# Patient Record
Sex: Male | Born: 1975 | Race: White | Hispanic: No | Marital: Married | Smoking: Never smoker
Health system: Southern US, Community
[De-identification: ages and names within clinical notes are randomized; demographics above are authoritative.]

## PROBLEM LIST (undated history)

## (undated) DIAGNOSIS — I10 Essential (primary) hypertension: Secondary | ICD-10-CM

## (undated) DIAGNOSIS — E7800 Pure hypercholesterolemia, unspecified: Secondary | ICD-10-CM

## (undated) DIAGNOSIS — R011 Cardiac murmur, unspecified: Secondary | ICD-10-CM

## (undated) DIAGNOSIS — G4730 Sleep apnea, unspecified: Secondary | ICD-10-CM

## (undated) DIAGNOSIS — Z86010 Personal history of colonic polyps: Secondary | ICD-10-CM

## (undated) DIAGNOSIS — K6389 Other specified diseases of intestine: Secondary | ICD-10-CM

## (undated) DIAGNOSIS — K219 Gastro-esophageal reflux disease without esophagitis: Secondary | ICD-10-CM

## (undated) DIAGNOSIS — J069 Acute upper respiratory infection, unspecified: Secondary | ICD-10-CM

## (undated) DIAGNOSIS — T7840XA Allergy, unspecified, initial encounter: Secondary | ICD-10-CM

## (undated) DIAGNOSIS — F419 Anxiety disorder, unspecified: Secondary | ICD-10-CM

## (undated) HISTORY — DX: Allergy, unspecified, initial encounter: T78.40XA

## (undated) HISTORY — DX: Other specified diseases of intestine: K63.89

## (undated) HISTORY — DX: Sleep apnea, unspecified: G47.30

## (undated) HISTORY — PX: HAND SURGERY: SHX662

## (undated) HISTORY — DX: Anxiety disorder, unspecified: F41.9

## (undated) HISTORY — DX: Acute upper respiratory infection, unspecified: J06.9

## (undated) HISTORY — DX: Cardiac murmur, unspecified: R01.1

## (undated) HISTORY — DX: Personal history of colonic polyps: Z86.010

## (undated) HISTORY — DX: Gastro-esophageal reflux disease without esophagitis: K21.9

## (undated) HISTORY — PX: KNEE ARTHROSCOPY WITH LATERAL MENISECTOMY: SHX6193

---

## 2008-08-09 ENCOUNTER — Emergency Department (HOSPITAL_COMMUNITY): Admission: EM | Admit: 2008-08-09 | Discharge: 2008-08-09 | Payer: Self-pay | Admitting: Internal Medicine

## 2010-12-26 ENCOUNTER — Encounter: Payer: Self-pay | Admitting: Family Medicine

## 2010-12-26 ENCOUNTER — Ambulatory Visit (INDEPENDENT_AMBULATORY_CARE_PROVIDER_SITE_OTHER): Payer: BC Managed Care – PPO | Admitting: Family Medicine

## 2010-12-26 VITALS — BP 120/84 | Temp 98.3°F | Ht 71.0 in | Wt 250.0 lb

## 2010-12-26 DIAGNOSIS — J029 Acute pharyngitis, unspecified: Secondary | ICD-10-CM

## 2010-12-26 LAB — POCT RAPID STREP A (OFFICE): Rapid Strep A Screen: NEGATIVE

## 2010-12-26 MED ORDER — HYDROCODONE-ACETAMINOPHEN 7.5-750 MG PO TABS: 1.0000 | ORAL_TABLET | Freq: Four times a day (QID) | ORAL | Status: AC | PRN

## 2010-12-26 NOTE — Progress Notes (Signed)
  Subjective:    Patient ID: Dennis Aguilar, male    DOB: [birth year], 35 y.o.   MRN: [id number]  HPI Dennis Aguilar is a 35 year old, married male, nonsmoker, who comes in today for evaluation of a one-day history of fever, chills, aching all over, severe sore throat.  Review of systems otherwise negative   Review of Systems Neg     Objective:   Physical Exam Well-developed well-nourished, male, no acute distress.  HEENT negative.  The neck was supple.  No adenopathy.  Throat was red.  No exudate.  Rapid strep negative       Assessment & Plan:  Viral sore throat,,,,,,,,,,, Tylenol and Vicodin because of severe pain.  Return p.r.n.

## 2010-12-26 NOTE — Patient Instructions (Signed)
Tylenol or Motrin for general fever, chills, etc.  Vicodin one half to one tablet every 4 to 6 hours as needed for severe pain.  Return p.r.n.

## 2011-07-29 LAB — POCT I-STAT, CHEM 8
Calcium, Ion: 1.17
Glucose, Bld: 100 — ABNORMAL HIGH
Hemoglobin: 15.6
Sodium: 139

## 2011-07-29 LAB — URINALYSIS, ROUTINE W REFLEX MICROSCOPIC
Bilirubin Urine: NEGATIVE
Hgb urine dipstick: NEGATIVE
pH: 6

## 2016-04-26 ENCOUNTER — Emergency Department (HOSPITAL_COMMUNITY)
Admission: EM | Admit: 2016-04-26 | Discharge: 2016-04-26 | Disposition: A | Discharge status: Home / Self Care | Payer: BLUE CROSS/BLUE SHIELD | Attending: Emergency Medicine | Admitting: Emergency Medicine

## 2016-04-26 ENCOUNTER — Emergency Department (HOSPITAL_COMMUNITY): Payer: BLUE CROSS/BLUE SHIELD

## 2016-04-26 ENCOUNTER — Encounter (HOSPITAL_COMMUNITY): Payer: Self-pay

## 2016-04-26 DIAGNOSIS — R142 Eructation: Secondary | ICD-10-CM

## 2016-04-26 DIAGNOSIS — Z8719 Personal history of other diseases of the digestive system: Secondary | ICD-10-CM | POA: Diagnosis not present

## 2016-04-26 DIAGNOSIS — K760 Fatty (change of) liver, not elsewhere classified: Secondary | ICD-10-CM | POA: Diagnosis not present

## 2016-04-26 DIAGNOSIS — I10 Essential (primary) hypertension: Secondary | ICD-10-CM | POA: Insufficient documentation

## 2016-04-26 DIAGNOSIS — K30 Functional dyspepsia: Secondary | ICD-10-CM | POA: Diagnosis not present

## 2016-04-26 DIAGNOSIS — Z79899 Other long term (current) drug therapy: Secondary | ICD-10-CM | POA: Insufficient documentation

## 2016-04-26 DIAGNOSIS — R140 Abdominal distension (gaseous): Secondary | ICD-10-CM | POA: Diagnosis not present

## 2016-04-26 DIAGNOSIS — R109 Unspecified abdominal pain: Secondary | ICD-10-CM | POA: Diagnosis not present

## 2016-04-26 DIAGNOSIS — R079 Chest pain, unspecified: Secondary | ICD-10-CM | POA: Diagnosis not present

## 2016-04-26 DIAGNOSIS — R030 Elevated blood-pressure reading, without diagnosis of hypertension: Secondary | ICD-10-CM | POA: Diagnosis not present

## 2016-04-26 DIAGNOSIS — R0602 Shortness of breath: Secondary | ICD-10-CM | POA: Diagnosis not present

## 2016-04-26 HISTORY — DX: Essential (primary) hypertension: I10

## 2016-04-26 HISTORY — DX: Pure hypercholesterolemia, unspecified: E78.00

## 2016-04-26 LAB — URINALYSIS, ROUTINE W REFLEX MICROSCOPIC
Bilirubin Urine: NEGATIVE
GLUCOSE, UA: NEGATIVE mg/dL
HGB URINE DIPSTICK: NEGATIVE
KETONES UR: 15 mg/dL — AB
LEUKOCYTES UA: NEGATIVE
Nitrite: NEGATIVE
PROTEIN: NEGATIVE mg/dL
Specific Gravity, Urine: 1.016 (ref 1.005–1.030)
pH: 6 (ref 5.0–8.0)

## 2016-04-26 LAB — BASIC METABOLIC PANEL
ANION GAP: 7 (ref 5–15)
BUN: 14 mg/dL (ref 6–20)
CALCIUM: 9.4 mg/dL (ref 8.9–10.3)
CO2: 26 mmol/L (ref 22–32)
Chloride: 105 mmol/L (ref 101–111)
Creatinine, Ser: 1.07 mg/dL (ref 0.61–1.24)
Glucose, Bld: 113 mg/dL — ABNORMAL HIGH (ref 65–99)
POTASSIUM: 4 mmol/L (ref 3.5–5.1)
SODIUM: 138 mmol/L (ref 135–145)

## 2016-04-26 LAB — I-STAT TROPONIN, ED: TROPONIN I, POC: 0 ng/mL (ref 0.00–0.08)

## 2016-04-26 LAB — CBC
HEMATOCRIT: 44.6 % (ref 39.0–52.0)
HEMOGLOBIN: 15.8 g/dL (ref 13.0–17.0)
MCH: 31.7 pg (ref 26.0–34.0)
MCHC: 35.4 g/dL (ref 30.0–36.0)
MCV: 89.4 fL (ref 78.0–100.0)
Platelets: 255 10*3/uL (ref 150–400)
RBC: 4.99 MIL/uL (ref 4.22–5.81)
RDW: 12.9 % (ref 11.5–15.5)
WBC: 6.8 10*3/uL (ref 4.0–10.5)

## 2016-04-26 MED ORDER — SODIUM CHLORIDE 0.9 % IV BOLUS (SEPSIS)
1000.0000 mL | Freq: Once | INTRAVENOUS | Status: AC
  Administered 2016-04-26: 1000 mL via INTRAVENOUS

## 2016-04-26 MED ORDER — IOPAMIDOL (ISOVUE-300) INJECTION 61%
INTRAVENOUS | Status: AC
  Administered 2016-04-26: 100 mL
  Filled 2016-04-26: qty 100

## 2016-04-26 MED ORDER — GI COCKTAIL ~~LOC~~
30.0000 mL | Freq: Once | ORAL | Status: AC
  Administered 2016-04-26: 30 mL via ORAL
  Filled 2016-04-26: qty 30

## 2016-04-26 NOTE — ED Notes (Signed)
Pt transported to CT ?

## 2016-04-26 NOTE — ED Provider Notes (Signed)
CSN: [id number]     Arrival date & time 04/26/16  1420 History   First MD Initiated Contact with Patient 04/26/16 1550     Chief Complaint  Patient presents with  . indigestion    PT IS A 39 YO WM WHO HAS HAD PERSISTENT BELCHING FOR 4 DAYS.  THE PT SAID THIS IS VERY UNUSUAL FOR HIM.  HE HAS BEEN TAKING OTC GERD MEDS, BUT THAT HAS NOT BEEN HELPING.  THE PT SAW HIS PCP TODAY AND WAS TOLD TO TAKE ZANTAC.  THE PT STARTED WORRYING THAT THIS WAS MAYBE A MORE SERIOUS PROBLEM, SO HE CAME TO THE ED.  HE HAS HAD SOME ABDOMINAL PAIN, BUT HE IS NOT SURE IF THAT IS FROM DOING PILATES THIS WEEK OR NOT.    (Consider location/radiation/quality/duration/timing/severity/associated sxs/prior Treatment) The history is provided by the patient.    Past Medical History  Diagnosis Date  . High cholesterol   . Hypertension    History reviewed. No pertinent past surgical history. No family history on file. Social History  Substance Use Topics  . Smoking status: Never Smoker   . Smokeless tobacco: Never Used  . Alcohol Use: None    Review of Systems  Gastrointestinal: Positive for abdominal pain.       BELCHING  All other systems reviewed and are negative.     Allergies  Review of patient's allergies indicates no known allergies.  Home Medications   Prior to Admission medications   Medication Sig Start Date End Date Taking? Authorizing Provider  alum & mag hydroxide-simeth (MAALOX/MYLANTA) 200-200-20 MG/5ML suspension Take 15 mLs by mouth every 6 (six) hours as needed for indigestion or heartburn.   Yes Historical Provider, MD  Ca Carbonate-Mag Hydroxide (ROLAIDS) 550-110 MG CHEW Chew 1 each by mouth daily as needed (for heartnurn).   Yes Historical Provider, MD  CRESTOR 10 MG tablet Take 20 mg by mouth daily.  03/06/16  Yes Historical Provider, MD  metoprolol succinate (TOPROL-XL) 50 MG 24 hr tablet Take 50 mg by mouth daily. 04/11/16  Yes Historical Provider, MD  simethicone (MYLICON) 0000000 MG  chewable tablet Chew 125 mg by mouth every 6 (six) hours as needed for flatulence.   Yes Historical Provider, MD  valsartan (DIOVAN) 160 MG tablet Take 160 mg by mouth daily. 04/14/16  Yes Historical Provider, MD   BP 136/78 mmHg  Pulse 66  Temp(Src) 97.6 F (36.4 C)  Resp 14  Ht 5\' 10"  (1.778 m)  Wt 260 lb (117.935 kg)  BMI 37.31 kg/m2  SpO2 99% Physical Exam  Constitutional: He is oriented to person, place, and time. He appears well-developed and well-nourished.  HENT:  Head: Normocephalic and atraumatic.  Right Ear: External ear normal.  Left Ear: External ear normal.  Nose: Nose normal.  Mouth/Throat: Oropharynx is clear and moist.  Eyes: Conjunctivae and EOM are normal. Pupils are equal, round, and reactive to light.  Neck: Normal range of motion. Neck supple.  Cardiovascular: Normal rate, regular rhythm, normal heart sounds and intact distal pulses.   Pulmonary/Chest: Effort normal and breath sounds normal.  Abdominal: Soft. Bowel sounds are normal.  Musculoskeletal: Normal range of motion.  Neurological: He is alert and oriented to person, place, and time.  Skin: Skin is warm and dry.  Psychiatric: He has a normal mood and affect. His behavior is normal. Judgment and thought content normal.  Nursing note and vitals reviewed.   ED Course  Procedures (including critical care time) Labs Review Labs Reviewed  BASIC METABOLIC PANEL - Abnormal; Notable for the following:    Glucose, Bld 113 (*)    All other components within normal limits  URINALYSIS, ROUTINE W REFLEX MICROSCOPIC (NOT AT [facility]) - Abnormal; Notable for the following:    Ketones, ur 15 (*)    All other components within normal limits  CBC  I-STAT TROPOININ, ED    Imaging Review Dg Chest 2 View  04/26/2016  CLINICAL DATA:  Shortness of breath. Chest pain and abdominal distention for 3 days. EXAM: CHEST  2 VIEW COMPARISON:  None. FINDINGS: The heart size and mediastinal contours are within normal limits.  Both lungs are clear. The visualized skeletal structures are unremarkable. IMPRESSION: Negative.  No active cardiopulmonary disease. Electronically Signed   By: Earle Gell M.D.   On: 04/26/2016 15:06   Ct Abdomen Pelvis W Contrast  04/26/2016  CLINICAL DATA:  Persistent belching since last Wednesday. EXAM: CT ABDOMEN AND PELVIS WITH CONTRAST TECHNIQUE: Multidetector CT imaging of the abdomen and pelvis was performed using the standard protocol following bolus administration of intravenous contrast. CONTRAST:  18mL ISOVUE-300 IOPAMIDOL (ISOVUE-300) INJECTION 61% COMPARISON:  None. FINDINGS: Lower chest:  Unremarkable. Hepatobiliary: The liver shows diffusely decreased attenuation suggesting steatosis. Scattered areas of sparing are seen along the gallbladder fossa. No focal abnormality within the liver parenchyma. There is no evidence for gallstones, gallbladder wall thickening, or pericholecystic fluid. No intrahepatic or extrahepatic biliary dilation. Pancreas: No focal mass lesion. No dilatation of the main duct. No intraparenchymal cyst. No peripancreatic edema. Spleen: No splenomegaly. No focal mass lesion. Adrenals/Urinary Tract: No adrenal nodule or mass. The kidneys are normal bilaterally. 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 No evidence for hydroureter. The urinary bladder appears normal for the degree of distention. Stomach/Bowel: Stomach is nondistended. No gastric wall thickening. No evidence of outlet obstruction. Duodenum is normally positioned as is the ligament of Treitz. No small bowel wall thickening. No small bowel dilatation. The terminal ileum is normal. The appendix is normal. No gross colonic mass. No colonic wall thickening. No substantial diverticular change. Vascular/Lymphatic: There is abdominal aortic atherosclerosis without aneurysm. There is no gastrohepatic or hepatoduodenal ligament lymphadenopathy. No intraperitoneal or  retroperitoneal lymphadenopathy. No pelvic sidewall lymphadenopathy. Reproductive: The prostate gland and seminal vesicles have normal imaging features. Other: No intraperitoneal free fluid. Musculoskeletal: Bone windows reveal no worrisome lytic or sclerotic osseous lesions. IMPRESSION: No acute findings in the abdomen or pelvis. Specifically, no findings to explain the patient's history of persistent belching. Hepatic steatosis. Electronically Signed   By: Misty Stanley M.D.   On: 04/26/2016 18:02   I have personally reviewed and evaluated these images and lab results as part of my medical decision-making.   EKG Interpretation   Date/Time:  Saturday April 26 2016 14:24:54 EDT Ventricular Rate:  72 PR Interval:  138 QRS Duration: 92 QT Interval:  394 QTC Calculation: 431 R Axis:   69 Text Interpretation:  Normal sinus rhythm Normal ECG Confirmed by Reid Nawrot  MD, [place] ([id number]) on 04/26/2016 3:50:56 PM      MDM  PT IS FEELING BETTER.  HE IS INSTR TO TAKE ZANTAC AND GAS-X.  HE IS INSTR TO AVOID CARBONATED BEV AND TO EAT SLOWLY.  RETURN IF WORSE. Final diagnoses:  Belching       Isla Pence, MD 04/26/16 [phone]

## 2016-04-26 NOTE — ED Notes (Signed)
Patient complains of 4 days of persistent burping. Seen by his MD today and started on reflux meds with no relief. denies nausea, denies chest pain

## 2016-04-28 DIAGNOSIS — R140 Abdominal distension (gaseous): Secondary | ICD-10-CM | POA: Diagnosis not present

## 2016-04-28 DIAGNOSIS — R142 Eructation: Secondary | ICD-10-CM | POA: Diagnosis not present

## 2016-11-17 DIAGNOSIS — M25512 Pain in left shoulder: Secondary | ICD-10-CM | POA: Diagnosis not present

## 2016-11-17 DIAGNOSIS — M25511 Pain in right shoulder: Secondary | ICD-10-CM | POA: Diagnosis not present

## 2016-12-04 DIAGNOSIS — M25312 Other instability, left shoulder: Secondary | ICD-10-CM | POA: Diagnosis not present

## 2016-12-04 DIAGNOSIS — R531 Weakness: Secondary | ICD-10-CM | POA: Diagnosis not present

## 2016-12-04 DIAGNOSIS — M25311 Other instability, right shoulder: Secondary | ICD-10-CM | POA: Diagnosis not present

## 2016-12-10 DIAGNOSIS — M25311 Other instability, right shoulder: Secondary | ICD-10-CM | POA: Diagnosis not present

## 2016-12-10 DIAGNOSIS — R531 Weakness: Secondary | ICD-10-CM | POA: Diagnosis not present

## 2016-12-10 DIAGNOSIS — M25312 Other instability, left shoulder: Secondary | ICD-10-CM | POA: Diagnosis not present

## 2016-12-18 DIAGNOSIS — R350 Frequency of micturition: Secondary | ICD-10-CM | POA: Diagnosis not present

## 2016-12-18 DIAGNOSIS — I10 Essential (primary) hypertension: Secondary | ICD-10-CM | POA: Diagnosis not present

## 2016-12-18 DIAGNOSIS — R531 Weakness: Secondary | ICD-10-CM | POA: Diagnosis not present

## 2016-12-18 DIAGNOSIS — M25312 Other instability, left shoulder: Secondary | ICD-10-CM | POA: Diagnosis not present

## 2016-12-18 DIAGNOSIS — N3000 Acute cystitis without hematuria: Secondary | ICD-10-CM | POA: Diagnosis not present

## 2016-12-18 DIAGNOSIS — M25311 Other instability, right shoulder: Secondary | ICD-10-CM | POA: Diagnosis not present

## 2016-12-18 DIAGNOSIS — R030 Elevated blood-pressure reading, without diagnosis of hypertension: Secondary | ICD-10-CM | POA: Diagnosis not present

## 2016-12-19 DIAGNOSIS — R531 Weakness: Secondary | ICD-10-CM | POA: Diagnosis not present

## 2016-12-19 DIAGNOSIS — M25312 Other instability, left shoulder: Secondary | ICD-10-CM | POA: Diagnosis not present

## 2016-12-19 DIAGNOSIS — M25311 Other instability, right shoulder: Secondary | ICD-10-CM | POA: Diagnosis not present

## 2016-12-24 DIAGNOSIS — M25312 Other instability, left shoulder: Secondary | ICD-10-CM | POA: Diagnosis not present

## 2016-12-24 DIAGNOSIS — R531 Weakness: Secondary | ICD-10-CM | POA: Diagnosis not present

## 2016-12-24 DIAGNOSIS — M25311 Other instability, right shoulder: Secondary | ICD-10-CM | POA: Diagnosis not present

## 2017-01-01 DIAGNOSIS — R531 Weakness: Secondary | ICD-10-CM | POA: Diagnosis not present

## 2017-01-01 DIAGNOSIS — M25311 Other instability, right shoulder: Secondary | ICD-10-CM | POA: Diagnosis not present

## 2017-01-01 DIAGNOSIS — M25312 Other instability, left shoulder: Secondary | ICD-10-CM | POA: Diagnosis not present

## 2017-02-25 ENCOUNTER — Ambulatory Visit
Payer: BLUE CROSS/BLUE SHIELD | Attending: Internal Medicine | Admitting: Rehabilitative and Restorative Service Providers"

## 2017-02-25 DIAGNOSIS — R2681 Unsteadiness on feet: Secondary | ICD-10-CM | POA: Diagnosis not present

## 2017-02-25 DIAGNOSIS — R2689 Other abnormalities of gait and mobility: Secondary | ICD-10-CM | POA: Diagnosis not present

## 2017-02-25 DIAGNOSIS — R42 Dizziness and giddiness: Secondary | ICD-10-CM | POA: Diagnosis not present

## 2017-02-25 NOTE — Patient Instructions (Signed)
Gaze Stabilization - Tip Card  1.Target must remain in focus, not blurry, and appear stationary while head is in motion. 2.Perform exercises with small head movements (45 to either side of midline). 3.Increase speed of head motion so long as target is in focus. 4.If you wear eyeglasses, be sure you can see target through lens (therapist will give specific instructions for bifocal / progressive lenses). 5.These exercises may provoke dizziness or nausea. Work through these symptoms. If too dizzy, slow head movement slightly. Rest between each exercise. 6.Exercises demand concentration; avoid distractions. 7.For safety, perform standing exercises close to a counter, wall, corner, or next to someone.  Copyright  VHI. All rights reserved.   Gaze Stabilization - Standing Feet Apart   Feet shoulder width apart, keeping eyes on target on wall 3 feet away, tilt head down slightly and move head side to side for 30 seconds. Repeat while moving head up and down for 30 seconds. *Work up to tolerating 60 seconds, as able. Do 2-3 sessions per day.    Copyright  VHI. All rights reserved.  Visuo-Vestibular: Head / Eyes Moving in Opposite Direction    Holding a target, keep eyes on target and slowly move target side to side while moving head in OPPOSITE direction of target for __3__ seconds. Perform sitting. Repeat __10__ times per session. Do __2__ sessions per day.   Copyright  VHI. All rights reserved.     Feet Together (Compliant Surface) Varied Arm Positions - Eyes Closed    Stand on compliant surface: _pillow_______ with feet together and arms out. Close eyes and visualize upright position. Hold__30__ seconds. Repeat __3__ times per session. Do __2__ sessions per day.  Copyright  VHI. All rights reserved.

## 2017-02-26 ENCOUNTER — Ambulatory Visit: Payer: BLUE CROSS/BLUE SHIELD | Admitting: Physical Therapy

## 2017-02-26 ENCOUNTER — Encounter: Payer: BLUE CROSS/BLUE SHIELD | Admitting: Physical Therapy

## 2017-02-26 NOTE — Therapy (Signed)
[facility] [address] [place], [place], [address] Phone: [phone]   Fax:  [phone]  Physical Therapy Evaluation  Patient Details  Name: Dennis Aguilar MRN: [id number] Date of Birth: May 20, 1976 Referring Provider: Suzi Roots, MD (@ [place])  Encounter Date: 02/25/2017      PT End of Session - 02/26/17 1531    Visit Number 1   Number of Visits 8   Date for PT Re-Evaluation 04/27/17   Authorization Type private insurance   PT Start Time 1320   PT Stop Time 1415   PT Time Calculation (min) 55 min   Activity Tolerance Patient tolerated treatment well   Behavior During Therapy [facility] for tasks assessed/performed      Past Medical History:  Diagnosis Date  . High cholesterol   . Hypertension     No past surgical history on file.  There were no vitals filed for this visit.       Subjective Assessment - 02/25/17 1326    Subjective The patient had onset of severe vertigo with nausea and difficulty walking on 01/26/2017.  He was seen at ED and provided valium.  The spinning stopped after the third day.  He has continued with oscillopsia with walking and driving, nausea with driving,   Overall symptoms are lessening.  He has been working on balance and head movement exercises at home since mid April.  He notes some unsteadiness on unlevel surfaces.  He does not notice any positional symptoms with bed mobility--at tmes, looking up quickly can be unsettling.    Saw ENT at [facility] except one note on the right side.    Pertinent History had a nose bleed last night; HTN, allergies, h/o nose bloods.   Patient Stated Goals see if symptoms can improve   Currently in Pain? No/denies            [facility] PT Assessment - 02/25/17 1348      Assessment   Medical Diagnosis vertigo   Referring Provider Suzi Roots, MD (@ [place])   Onset Date/Surgical Date 01/26/17   Prior Therapy none     Precautions   Precautions None     Restrictions   Weight Bearing Restrictions No     Balance Screen   Has the patient fallen in the past 6 months No   Has the patient had a decrease in activity level because of a fear of falling?  Yes  due to onset of vertigo   Is the patient reluctant to leave their home because of a fear of falling?  No  has returned to driving recently     [place] residence   Living Arrangements Spouse/significant other   Type of [place] to enter   Home Layout Two level   [place] None     Prior Function   Level of Independence Independent   Vocation Full time employment   Leisure active with family/ kids' sports     Observation/Other Assessments   Focus on Therapeutic Outcomes (FOTO)  74%   Other Surveys  Other Surveys   Dizziness Handicap Inventory [facility])  62%     Standardized Balance Assessment   Biomedical engineer    Results Scores 59% compared to age/heigh normative values of 42%.  Patient WNLs use of somatosensory input for balance, and  mildly diminished use of visual (60% compared to norm of 70%) and vestibular (52% compared to 55%) inputs for balance.            Vestibular Assessment - 02/25/17 1336      Vestibular Assessment   General Observation The patient walks independently into clinic without a device independently.     Symptom Behavior   Type of Dizziness Oscillopsia  imbalance   Frequency of Dizziness daily   Duration of Dizziness not true dizziness at this time   Aggravating Factors --  driving   Relieving Factors Head stationary     Occulomotor Exam   Occulomotor Alignment Normal   Spontaneous Absent   Gaze-induced Absent   Head shaking Horizontal --  noted 2-3 beats of nystagmus upon opening eyes   Smooth Pursuits Intact   Saccades Intact     Vestibulo-Occular Reflex    VOR 1 Head Only (x 1 viewing) slow gaze- patient able to perform with trace of dizziness   VOR 2 Head and Object (x 2 viewing) at slow pace, patient able to maintain contact   Comment Head impulse test=positive bilaterally with small amplitude refixation saccade.     Visual Acuity   Static line 8   Dynamic line 3  5 line difference indicating decreased VOR     Positional Testing   Dix-Hallpike Dix-Hallpike Right;Dix-Hallpike Left  used frenzel lenses to block fixation   Horizontal Canal Testing Horizontal Canal Right;Horizontal Canal Left     Dix-Hallpike Right   Dix-Hallpike Right Duration none   Dix-Hallpike Right Symptoms No nystagmus     Dix-Hallpike Left   Dix-Hallpike Left Duration none   Dix-Hallpike Left Symptoms No nystagmus     Horizontal Canal Right   Horizontal Canal Right Duration none   Horizontal Canal Right Symptoms Normal     Horizontal Canal Left   Horizontal Canal Left Duration none   Horizontal Canal Left Symptoms Normal               [facility] Adult PT Treatment/Exercise - 02/26/17 1542      Neuro Re-ed    Neuro Re-ed Details  Standing in corner with eyes closed on compliant surface with feet narrow base of support for HEP.          Vestibular Treatment/Exercise - 02/26/17 1542      Vestibular Treatment/Exercise   Vestibular Treatment Provided Gaze   Gaze Exercises X1 Viewing Horizontal;X1 Viewing Vertical;X2 Viewing Horizontal     X1 Viewing Horizontal   Foot Position standing feet apart   Comments 30 seconds with cues to slow movement and maintain visual fixation without retinal slip (blurring of target)     X1 Viewing Vertical   Foot Position standing feet apart   Comments 30 seconds with cues on technique     X2 Viewing Horizontal   Foot Position seated    Comments with cues for 10 reps working on eye/head coordination and VOR.               PT Education - 02/26/17 1530    Education provided Yes   Education Details  HEP: gaze x 1 adaptation exercises standing, gaze x 2 adaptation seated, standing pillow + feet together + eyes closed   Person(s) Educated Patient   Methods Explanation;Demonstration;Handout   Comprehension Verbalized understanding;Returned demonstration          PT Short Term Goals - 02/26/17 1543      PT SHORT TERM GOAL #1   Title  The patient will be indep with HEP for gaze adaptation, balance and visual/vestibular exercises.   Baseline Target date 03/28/2017   Time 4   Period Weeks     PT SHORT TERM GOAL #2   Title The patient will demonstrate gaze x 1 x 60 seconds without evidence of retinal slip (per corrective/refixation saccade).   Baseline Target date 03/28/2017   Time 4   Period Weeks     PT SHORT TERM GOAL #3   Title The patient will improve SVA vs. DVA to 4 line difference to demo improving use of VOR.   Baseline Target date 03/28/2017   Time 4   Period Weeks           PT Long Term Goals - 02/26/17 1545      PT LONG TERM GOAL #1   Title The patient will improve DHI from 62% to < or equal to 42% to demo improved self perception of dizziness.   Baseline Target date 04/27/2017   Time 8   Period Weeks     PT LONG TERM GOAL #2   Title The patient will improve static versus dynamic visual acuity to 3 line difference to demo improved use of VOR.   Baseline Target date 04/27/2017   Time 8   Period Weeks     PT LONG TERM GOAL #3   Title The patient will improve SOT from 59% to > or equal to 70% to demo improved use of sensory systems for balance.   Baseline Target date 04/27/2017   Time 8   Period Weeks     PT LONG TERM GOAL #4   Title The patient will return to recreational activities including golf, longer drives without subjective c/o visual blurring.   Baseline Target date 04/27/2017   Time 8   Period Weeks               Plan - 02/26/17 1554    Clinical Impression Statement The patient is a 42 yo male s/p sudden onset of vertigo in April 2018.  He  initially experienced 3 days of constant vertigo most consistent with clinical presentation of a vestibular neuritis (as hearing was spared).  He continues to c/o visual bouncing with walking, motion sickness with driving, and intermittent unsteadiness.  He has not returned to all recreational tasks due to symptoms.  PT to progress gaze adaptation, high level balance and mulit-sensory balance challenges while encouraging slow return to recreational/sports activities.   Rehab Potential Good   PT Frequency 1x / week   PT Duration 8 weeks   PT Treatment/Interventions ADLs/Self Care Home Management;Therapeutic activities;Therapeutic exercise;Vestibular;Canalith Repostioning;Neuromuscular re-education;Balance training;Gait training;Functional mobility training;Patient/family education   PT Next Visit Plan Progress HEP, discuss return to sports/golf, dynamic gait, VOR x 2 viewing, VOR x 1 on busy background   Consulted and Agree with Plan of Care Patient      Patient will benefit from skilled therapeutic intervention in order to improve the following deficits and impairments:  Dizziness, Impaired vision/preception, Difficulty walking, Decreased balance, Decreased activity tolerance  Visit Diagnosis: Dizziness and giddiness  Other abnormalities of gait and mobility  Unsteadiness on feet     Problem List There are no active problems to display for this patient.   [place], [place] 02/26/2017, 3:57 PM  [facility] [address] [place], [place], [address] Phone: [phone]   Fax:  [phone]  Name: Rayaan Garguilo MRN: [id number] Date of Birth: [birth year]

## 2017-03-04 ENCOUNTER — Ambulatory Visit: Payer: BLUE CROSS/BLUE SHIELD | Admitting: Rehabilitative and Restorative Service Providers"

## 2017-03-06 ENCOUNTER — Ambulatory Visit: Payer: BLUE CROSS/BLUE SHIELD | Admitting: Rehabilitative and Restorative Service Providers"

## 2017-03-06 DIAGNOSIS — R42 Dizziness and giddiness: Secondary | ICD-10-CM

## 2017-03-06 DIAGNOSIS — R2681 Unsteadiness on feet: Secondary | ICD-10-CM

## 2017-03-06 DIAGNOSIS — R2689 Other abnormalities of gait and mobility: Secondary | ICD-10-CM

## 2017-03-06 NOTE — Patient Instructions (Signed)
BEGIN TO ADD MORE ACTIVITIES TO DAY TO DAY INCLUDING:  GOLF:   Begin to work on the putting green and chip shots (slower speed swings) and then slowly progress to driving as you can maintain focus on the ball.  TENNIS BALL: Walk tossing a ball or stand on one leg tossing a ball 10 times to work on dynamic balance.  MARCHING IN PLACE: 20 marches in place while fixating on one target.  [place]

## 2017-03-06 NOTE — Therapy (Signed)
[facility] [address] [place], [place], [address] Phone: [phone]   Fax:  [phone]  Physical Therapy Treatment  Patient Details  Name: Dennis Aguilar MRN: [id number] Date of Birth: September 19, 1976 Referring Provider: Suzi Roots, MD (@ [place])  Encounter Date: 03/06/2017      PT End of Session - 03/06/17 1506    Visit Number 2   Number of Visits 8   Date for PT Re-Evaluation 04/27/17   Authorization Type private insurance   PT Start Time 1410   PT Stop Time 1500   PT Time Calculation (min) 50 min   Activity Tolerance Patient tolerated treatment well   Behavior During Therapy [facility] for tasks assessed/performed      Past Medical History:  Diagnosis Date  . High cholesterol   . Hypertension     No past surgical history on file.  There were no vitals filed for this visit.      Subjective Assessment - 03/06/17 1407    Subjective The patient has some hissing, whistle sound in his R ear.  He notes intermittent times. He has some days where he feels 75% better.  He still gets some nausea and dizziness with exercises.    Pertinent History had a nose bleed last night; HTN, allergies, h/o nose bloods.   Patient Stated Goals see if symptoms can improve   Currently in Pain? No/denies                Vestibular Assessment - 03/06/17 1412      Vestibular Assessment   General Observation The patient reports driving on smooth road no longer provokes symptoms, however driving on bumpy road provokes symptoms.                  [facility] Adult PT Treatment/Exercise - 03/06/17 1513      Self-Care   Self-Care Other Self-Care Comments   Other Self-Care Comments  Patient and PT discussed modifications to recreational activities to begin to push return to prior level of function.  For golf, recommended begin with putting green, chip shots versus driving due to lower amplitude/slower movement.   For jogging, recommended he begin with marching using visual fixation.  For eye/hand coordination, recommended the patient begin doing ball toss with walking.       Neuro Re-ed    Neuro Re-ed Details  Standing 360 degree turns, standing ball toss while walking, single leg stance ball toss x 10 reps each  leg, BOSU standing adding head motion with postural correction.         Vestibular Treatment/Exercise - 03/06/17 1447      Vestibular Treatment/Exercise   Vestibular Treatment Provided Habituation;Gaze     X1 Viewing Horizontal   Foot Position standing 3 feet away from target   Comments Attempted at 8 feet, however patient c/o more visual blurring.  Recommended remain at 3 ft for now.  Performed with cues on speed and visual fixation.     X1 Viewing Vertical   Foot Position standing 3 feet from target   Comments 30 seconds with cues on speed of movement     X2 Viewing Horizontal   Foot Position standing     Comments x 10 reps.               PT Education - 03/06/17 1454    Education provided Yes   Education Details HEP:  gold swing, marching, ball toss   Person(s) Educated Patient  Methods Explanation;Demonstration;Handout   Comprehension Returned demonstration;Verbalized understanding          PT Short Term Goals - 03/06/17 1506      PT SHORT TERM GOAL #1   Title The patient will be indep with HEP for gaze adaptation, balance and visual/vestibular exercises.   Baseline Target date 03/28/2017   Time 4   Period Weeks   Status Achieved     PT SHORT TERM GOAL #2   Title The patient will demonstrate gaze x 1 x 60 seconds without evidence of retinal slip (per corrective/refixation saccade).   Baseline Target date 03/28/2017   Time 4   Period Weeks   Status On-going     PT SHORT TERM GOAL #3   Title The patient will improve SVA vs. DVA to 4 line difference to demo improving use of VOR.   Baseline Target date 03/28/2017   Time 4   Period Weeks   Status On-going            PT Long Term Goals - 02/26/17 1545      PT LONG TERM GOAL #1   Title The patient will improve DHI from 62% to < or equal to 42% to demo improved self perception of dizziness.   Baseline Target date 04/27/2017   Time 8   Period Weeks     PT LONG TERM GOAL #2   Title The patient will improve static versus dynamic visual acuity to 3 line difference to demo improved use of VOR.   Baseline Target date 04/27/2017   Time 8   Period Weeks     PT LONG TERM GOAL #3   Title The patient will improve SOT from 59% to > or equal to 70% to demo improved use of sensory systems for balance.   Baseline Target date 04/27/2017   Time 8   Period Weeks     PT LONG TERM GOAL #4   Title The patient will return to recreational activities including golf, longer drives without subjective c/o visual blurring.   Baseline Target date 04/27/2017   Time 8   Period Weeks               Plan - 03/06/17 1507    Clinical Impression Statement The patient is noticing gradual improvement in symptoms reporting improving ability to tolerate driving, some return to recreational actvities (modified), and some days without symptoms. PT progressed dynamic gait activities and discussed return to recreation.  Will f/u in 2 weeks.   PT Treatment/Interventions ADLs/Self Care Home Management;Therapeutic activities;Therapeutic exercise;Vestibular;Canalith Repostioning;Neuromuscular re-education;Balance training;Gait training;Functional mobility training;Patient/family education   PT Next Visit Plan Progress gaze adding greater distance, VOR x 2 viewing, recreational activities, retest SOT, begin checking LTGs, schedule if need further intervention.   Consulted and Agree with Plan of Care Patient      Patient will benefit from skilled therapeutic intervention in order to improve the following deficits and impairments:  Dizziness, Impaired vision/preception, Difficulty walking, Decreased balance, Decreased activity  tolerance  Visit Diagnosis: Dizziness and giddiness  Other abnormalities of gait and mobility  Unsteadiness on feet     Problem List There are no active problems to display for this patient.   [place], [place] 03/06/2017, 3:16 PM  [facility] [address] [place], [place], [address] Phone: [phone]   Fax:  [phone]  Name: Dennis Aguilar MRN: [id number] Date of Birth: 02-16-1976

## 2017-03-18 ENCOUNTER — Ambulatory Visit: Payer: BLUE CROSS/BLUE SHIELD | Admitting: Rehabilitative and Restorative Service Providers"

## 2017-03-18 DIAGNOSIS — R42 Dizziness and giddiness: Secondary | ICD-10-CM | POA: Diagnosis not present

## 2017-03-18 DIAGNOSIS — R2681 Unsteadiness on feet: Secondary | ICD-10-CM | POA: Diagnosis not present

## 2017-03-18 DIAGNOSIS — R2689 Other abnormalities of gait and mobility: Secondary | ICD-10-CM | POA: Diagnosis not present

## 2017-03-18 NOTE — Therapy (Signed)
[facility] [address] [place], [place], [address] Phone: [phone]   Fax:  [phone]  Physical Therapy Treatment  Patient Details  Name: Dennis Aguilar MRN: [id number] Date of Birth: 22-Oct-1976 Referring Provider: Suzi Roots, MD (@ [place])  Encounter Date: 03/18/2017      PT End of Session - 03/18/17 2054    Visit Number 3   Number of Visits 8   Date for PT Re-Evaluation 04/27/17   Authorization Type private insurance   PT Start Time 8676   PT Stop Time 1408   PT Time Calculation (min) 46 min   Activity Tolerance Patient tolerated treatment well   Behavior During Therapy [facility] for tasks assessed/performed      Past Medical History:  Diagnosis Date  . High cholesterol   . Hypertension     No past surgical history on file.  There were no vitals filed for this visit.      Subjective Assessment - 03/18/17 1325    Subjective Patient has returned to playing golf.  He feels when driving things are 19% normal except when hitting bumps (things move).  Walking is solid, except when in a hurry.  He feels whoozy intermittently.  With jogging, he notes some visual bouncing, but this is improving.    Patient Stated Goals see if symptoms can improve                         [facility] Adult PT Treatment/Exercise - 03/18/17 2059      Standardized Balance Assessment   Standardized Balance Assessment Balance Master Testing     High Level Balance   High Level Balance Comments Balance master testing with composite equilibrium score of 75% compared to age/heigh norms of 72%.  Patient now scores W NLs for somatosensory, visual and vestibular use demonstrating improved function in multi-sensory environments.       Self-Care   Self-Care Other Self-Care Comments   Other Self-Care Comments  Discussed continued return to recreational activities, progression of HEP.  D/c'd balance exercises as  patient is performing more demanding daily tasks and end goal is to return to prior functional status.  Patient educated on progression of gaze and recommended return to clinic if any questions with exercises or slowed progress.  AT this time, anticipate continued improvement as patient has shown steady progress since evaluation.          Vestibular Treatment/Exercise - 03/18/17 2100      Vestibular Treatment/Exercise   Vestibular Treatment Provided Gaze     X1 Viewing Horizontal   Foot Position Progressed to 6-8 feet from target    Comments patieint has to slow speed to maintain focus at greater distance.  Recommended he return to 30 seconds of performance and work up to 60 seconds.  Also performed x 1 viewing on busy background with mild nause noted.  Performed busy background at 3 feet distance from target.      X2 Viewing Horizontal    Comments patient to continue per HEP.                PT Education - 03/18/17 2054    Education provided Yes   Education Details HEP: progression of letter exercise to greater distance and then on busy background   Person(s) Educated Patient   Methods Explanation;Demonstration;Handout   Comprehension Verbalized understanding;Returned demonstration          PT Short Term Goals -  03/18/17 2054      PT SHORT TERM GOAL #1   Title The patient will be indep with HEP for gaze adaptation, balance and visual/vestibular exercises.   Baseline Target date 03/28/2017   Time 4   Period Weeks   Status Achieved     PT SHORT TERM GOAL #2   Title The patient will demonstrate gaze x 1 x 60 seconds without evidence of retinal slip (per corrective/refixation saccade).   Baseline Met on 03/18/17 -- patient has to slow pace in order to maintain fixation.   Time 4   Period Weeks   Status Achieved     PT SHORT TERM GOAL #3   Title The patient will improve SVA vs. DVA to 4 line difference to demo improving use of VOR.   Baseline Not reassessed, however  overall reports of visual oscillopsia improving.   Time 4   Period Weeks   Status Deferred           PT Long Term Goals - 03/18/17 2055      PT LONG TERM GOAL #1   Title The patient will improve DHI from 62% to < or equal to 42% to demo improved self perception of dizziness.   Baseline Target date 04/27/2017   Time 8   Period Weeks     PT LONG TERM GOAL #2   Title The patient will improve static versus dynamic visual acuity to 3 line difference to demo improved use of VOR.   Baseline Not retested.   Time 8   Period Weeks   Status Deferred     PT LONG TERM GOAL #3   Title The patient will improve SOT from 59% to > or equal to 70% to demo improved use of sensory systems for balance.   Baseline Improved to 75% on 03/18/2017   Time 8   Period Weeks   Status Achieved     PT LONG TERM GOAL #4   Title The patient will return to recreational activities including golf, longer drives without subjective c/o visual blurring.   Baseline Met on 03/18/17 with return to golf and 95% improvement in driving.   Time 8   Period Weeks   Status Achieved               Plan - 03/18/17 2057    Clinical Impression Statement The patient reports continued improvement with visual blurring, balance, and has returned to recreational activities.  PT recommended he continue to progress gaze x 1 viewing adaptation exercises and call for return visit in 2-3 weeks if symptoms still persisting.  He is showing continued improvement and progression of HEP provided for self treatment.  Patient met LTG for sensory organization testing.     PT Treatment/Interventions ADLs/Self Care Home Management;Therapeutic activities;Therapeutic exercise;Vestibular;Canalith Repostioning;Neuromuscular re-education;Balance training;Gait training;Functional mobility training;Patient/family education   PT Next Visit Plan f/u if needed; plan to d/c in 1 month if no further visits needed.   Consulted and Agree with Plan of Care  Patient      Patient will benefit from skilled therapeutic intervention in order to improve the following deficits and impairments:  Dizziness, Impaired vision/preception, Difficulty walking, Decreased balance, Decreased activity tolerance  Visit Diagnosis: Dizziness and giddiness  Unsteadiness on feet  Other abnormalities of gait and mobility     Problem List There are no active problems to display for this patient.   [place], [place] 03/18/2017, 9:04 PM  [facility] [address]  [place], [place], [address] Phone: [phone]   Fax:  [phone]  Name: Dennis Aguilar MRN: [id number] Date of Birth: 1976-07-08

## 2017-03-18 NOTE — Patient Instructions (Signed)
PROGRESSION OF LETTER EXERCISE:  1) Move to 8 feet away from letter.  Slow pace to be able to maintain focus while turning side to side.  Begin at 30 seconds and work up to 60 seconds.  2) Place a busy background (wrapping paper) behind letter on back of a door.  Stand at 3 feet from letter.  Perform for 30 seconds and work up to 60 seconds.   Gaze Stabilization - Tip Card  1.Target must remain in focus, not blurry, and appear stationary while head is in motion. 2.Perform exercises with small head movements (45 to either side of midline). 3.Increase speed of head motion so long as target is in focus. 4.If you wear eyeglasses, be sure you can see target through lens (therapist will give specific instructions for bifocal / progressive lenses). 5.These exercises may provoke dizziness or nausea. Work through these symptoms. If too dizzy, slow head movement slightly. Rest between each exercise. 6.Exercises demand concentration; avoid distractions. 7.For safety, perform standing exercises close to a counter, wall, corner, or next to someone.  Copyright  VHI. All rights reserved.   Gaze Stabilization - Standing Feet Apart   Feet shoulder width apart, keeping eyes on target on wall 3 feet away, tilt head down slightly and move head side to side for 30 seconds. Repeat while moving head up and down for 30 seconds. *Work up to tolerating 60 seconds, as able. Do 2-3 sessions per day.  Visuo-Vestibular: Head / Eyes Moving in Opposite Direction    Holding a target, keep eyes on target and slowly move target side to side while moving head in OPPOSITE direction of target for __3__ seconds. Perform sitting. Repeat __10__ times per session. Do __2__ sessions per day.   Copyright  VHI. All rights reserved.

## 2017-04-30 ENCOUNTER — Encounter: Payer: Self-pay | Admitting: Rehabilitative and Restorative Service Providers"

## 2017-04-30 NOTE — Therapy (Signed)
[facility] [address] [place], [place], [address] Phone: [phone]   Fax:  [phone]  Patient Details  Name: Dennis Aguilar MRN: [id number] Date of Birth: 08/27/1976 Referring Provider:  No ref. provider found  Encounter Date: last encounter 03/18/2017  PHYSICAL THERAPY DISCHARGE SUMMARY  Visits from Start of Care: 3  Current functional level related to goals / functional outcomes:     PT Short Term Goals - 03/18/17 2054      PT SHORT TERM GOAL #1   Title The patient will be indep with HEP for gaze adaptation, balance and visual/vestibular exercises.   Baseline Target date 03/28/2017   Time 4   Period Weeks   Status Achieved     PT SHORT TERM GOAL #2   Title The patient will demonstrate gaze x 1 x 60 seconds without evidence of retinal slip (per corrective/refixation saccade).   Baseline Met on 03/18/17 -- patient has to slow pace in order to maintain fixation.   Time 4   Period Weeks   Status Achieved     PT SHORT TERM GOAL #3   Title The patient will improve SVA vs. DVA to 4 line difference to demo improving use of VOR.   Baseline Not reassessed, however overall reports of visual oscillopsia improving.   Time 4   Period Weeks   Status Deferred         PT Long Term Goals - 03/18/17 2055      PT LONG TERM GOAL #1   Title The patient will improve DHI from 62% to < or equal to 42% to demo improved self perception of dizziness.   Baseline Target date 04/27/2017   Time 8   Period Weeks     PT LONG TERM GOAL #2   Title The patient will improve static versus dynamic visual acuity to 3 line difference to demo improved use of VOR.   Baseline Not retested.   Time 8   Period Weeks   Status Deferred     PT LONG TERM GOAL #3   Title The patient will improve SOT from 59% to > or equal to 70% to demo improved use of sensory systems for balance.   Baseline Improved to 75% on 03/18/2017   Time 8   Period Weeks    Status Achieved     PT LONG TERM GOAL #4   Title The patient will return to recreational activities including golf, longer drives without subjective c/o visual blurring.   Baseline Met on 03/18/17 with return to golf and 95% improvement in driving.   Time 8   Period Weeks   Status Achieved        Remaining deficits: Minimal c/o visual blurring/oscillopsia with driving. Oscillopsia intermittently with jogging.   Education / Equipment: HEP, return to recreational activities.   Plan: Patient agrees to discharge.  Patient goals were partially met. Patient is being discharged due to meeting the stated rehab goals.  ?????         Thank you for the referral of this patient. Christina Weaver, MPT    WEAVER,CHRISTINA 04/30/2017, 9:16 AM  [facility] [address] [place], [place], [address] Phone: [phone]   Fax:  [phone] 

## 2017-07-09 ENCOUNTER — Encounter: Payer: Self-pay | Admitting: Family Medicine

## 2017-10-27 HISTORY — PX: COLONOSCOPY WITH ESOPHAGOGASTRODUODENOSCOPY (EGD): SHX5779

## 2017-12-04 ENCOUNTER — Encounter: Payer: Self-pay | Admitting: Internal Medicine

## 2018-01-18 ENCOUNTER — Encounter: Payer: Self-pay | Admitting: Internal Medicine

## 2018-01-18 ENCOUNTER — Encounter (INDEPENDENT_AMBULATORY_CARE_PROVIDER_SITE_OTHER): Payer: Self-pay

## 2018-01-18 ENCOUNTER — Ambulatory Visit: Payer: BLUE CROSS/BLUE SHIELD | Admitting: Internal Medicine

## 2018-01-18 VITALS — BP 150/98 | HR 84 | Ht 70.25 in | Wt 262.2 lb

## 2018-01-18 DIAGNOSIS — R142 Eructation: Secondary | ICD-10-CM | POA: Diagnosis not present

## 2018-01-18 DIAGNOSIS — R140 Abdominal distension (gaseous): Secondary | ICD-10-CM

## 2018-01-18 NOTE — Patient Instructions (Signed)
We are going to obtain your records for review.   We are ordering a SIBO Lactulose breath test that will be mailed to you to complete and mail in.  You will be contacted by [facility] by phone to confirm address, etc.   I appreciate the opportunity to care for you. Silvano Rusk, MD, [facility]

## 2018-01-18 NOTE — Progress Notes (Signed)
Zerrick Hanssen 42 y.o. [birth year] [id number] Referred by: Tempie Hoist, FNP  Assessment & Plan:   Encounter Diagnoses  Name Primary?  . Belching Yes  . Abdominal bloating     Small intestinal bacterial overgrowth I suppose is 1 possible cause of his problems.  I have explained to him that this can be difficult to sort out.  The time course of his problems and changes makes him think that what he did for the diagnosis of candidiasis and small intestinal bacterial overgrowth as recommended by a holistic practitioner helped his vision difficulties.  I suppose that is possible but I have a hard time making that connection.  However I do think small intestinal bacterial overgrowth might have something to do with his bloating and belching.  At this point plan for a small intestinal bacterial overgrowth lactulose hydrogen breath test.  If positive will treat with antibiotics.  If that is negative consider barium studies again versus EGD.  We have requested records since 2017 from his primary care provider in [place] and the physician he saw in [place].  I appreciate the opportunity to care for this patient. CC: Josem Kaufmann, MD   Subjective:   Chief Complaint: Belching and bloating  HPI The patient is a 42 year old white man here with complaints of bloating and belching, referred by Dr. Tawny Asal.  In the summer 2017 for some reason not known to him he developed sudden onset of belching.  [place] [facility] [facility] [place] [facility] and actually had a CT of the abdomen and pelvis here in [place] and I have looked at the report and images and it was unremarkable.  He did have hepatic steatosis.  IMPRESSION: No acute findings in the abdomen or pelvis. Specifically, no findings to explain the patient's history of persistent belching.  Hepatic steatosis.   Electronically Signed   By: Misty Stanley M.D.   On: 04/26/2016 18:02  He saw different physicians and nobody can  determine a problem, but he actually also then developed severe vertigo at some point in the interim, and had persistent visual difficulty.  He saw a physician in [facility] that was a primary care provider his father new and also saw Dr. settle and I think saw neurology.  Along the way he saw a gastroenterologist in [place] at some point I think prior to that.  Barium swallows have been done at least once perhaps twice and he reports negative.  He has been fearful of having an EGD which was recommended by a gastroenterologist in [place].  He saw a holistic physicians and they thought he might have small intestinal bacterial overgrowth and Candida, and treated him with diet and some supplements and lemon with apple cider vinegar.  He got some relief with that and after 6 months of problems where he was really ill and in the bed for 2 months with the visual difficulties he could see without problems again.  He has not taken any antibiotics for the small intestinal bacterial overgrowth nor has he had breath testing.  He has been tested for celiac disease and says it was negative.  He has very rare mild constipation.  When he belches he does not get relief of the pressure and bloating.  He has been on olmesartan and other medications stopped all of his medications for about 8-9 months.  At home his blood pressure once 130-135 over about 85.  It is always high when he comes to  the doctor especially when he is off medicines.  He has not noted any specific dietary problems or triggers etc.  I do not think he drinks excessive carbonated beverages but need to clarify he is frustrated by the persistent symptoms.  The symptoms do not disturb his sleep though he suffers from some insomnia issues there is been no unintentional weight loss and his entire GI review of systems is otherwise negative.  No Known Allergies Current Meds  Medication Sig  . Multiple Vitamin (MULTIVITAMIN) tablet Take 1 tablet by mouth daily.    Past Medical History:  Diagnosis Date  . Anxiety   . GERD (gastroesophageal reflux disease)   . High cholesterol   . Hypertension    Past Surgical History:  Procedure Laterality Date  . HAND SURGERY Right   . KNEE ARTHROSCOPY WITH LATERAL MENISECTOMY Left    Social History   Social History Narrative   The patient is married with 6 sons as of 2019 they range in age from 1-13   He is a Teacher, adult education   2 caffeinated beverages daily   No smoking tobacco or drug use or alcohol   family history includes Dementia in his paternal grandfather; Heart attack in his maternal grandfather; Heart disease in his father and paternal grandfather.   Review of Systems As mentioned in the HPI, some history of headaches anxiety symptoms allergies insomnia and excessive urination.  All other review of systems are negative  Objective:   Physical Exam @BP  (!) 150/98 (BP Location: Left Arm, Patient Position: Sitting, Cuff Size: Normal)   Pulse 84   Ht 5' 10.25" (1.784 m) Comment: height measured without shoes  Wt 262 lb 4 oz (119 kg)   BMI 37.36 kg/m @  General:  Well-developed, well-nourished and in no acute distress Eyes:  anicteric. ENT:   Mouth and posterior pharynx free of lesions.  Neck:   supple w/o thyromegaly or mass.  Lungs: Clear to auscultation bilaterally. Heart:  S1S2, no rubs, murmurs, gallops. Abdomen:  soft, non-tender, no hepatosplenomegaly, hernia, or mass and BS+.  Lymph:  no cervical or supraclavicular adenopathy. Extremities:   no edema, cyanosis or clubbing Skin   no rash. Neuro:  A&O x 3.  Psych:  appropriate mood and  Affect.   Data Reviewed: See HPI and assessment and plan

## 2018-01-19 ENCOUNTER — Encounter: Payer: Self-pay | Admitting: Internal Medicine

## 2018-01-21 ENCOUNTER — Telehealth: Payer: Self-pay | Admitting: *Deleted

## 2018-01-21 NOTE — Telephone Encounter (Signed)
Patient requested to have to have medical records from Dr. Halina Maidens office sent over to Dr.Gessner . Request faxed to requested office.

## 2018-01-25 ENCOUNTER — Encounter: Payer: Self-pay | Admitting: Internal Medicine

## 2018-01-25 DIAGNOSIS — R142 Eructation: Secondary | ICD-10-CM | POA: Diagnosis not present

## 2018-01-25 DIAGNOSIS — R140 Abdominal distension (gaseous): Secondary | ICD-10-CM | POA: Diagnosis not present

## 2018-02-02 ENCOUNTER — Encounter: Payer: Self-pay | Admitting: Internal Medicine

## 2018-02-02 ENCOUNTER — Telehealth: Payer: Self-pay

## 2018-02-02 DIAGNOSIS — K6389 Other specified diseases of intestine: Secondary | ICD-10-CM

## 2018-02-02 DIAGNOSIS — K638219 Small intestinal bacterial overgrowth, unspecified: Secondary | ICD-10-CM

## 2018-02-02 HISTORY — DX: Small intestinal bacterial overgrowth, unspecified: K63.8219

## 2018-02-02 HISTORY — DX: Other specified diseases of intestine: K63.89

## 2018-02-02 MED ORDER — RIFAXIMIN 550 MG PO TABS: 550.0000 mg | ORAL_TABLET | Freq: Three times a day (TID) | ORAL | 0 refills | Status: DC

## 2018-02-02 NOTE — Telephone Encounter (Signed)
-----   Message from Gatha Mayer, MD sent at 02/02/2018  1:26 PM EDT ----- Regarding: + SIBO test start Xifaxan Please contact him and Rx Xifaxan 550 mg tid x 14 d  Can use an IBS and SIBO dx  He should contact us after he takes Abx to let me know how he feels

## 2018-02-02 NOTE — Telephone Encounter (Signed)
I would bot treat for Candida - not in my realm of understanding of this problem so no

## 2018-02-02 NOTE — Telephone Encounter (Signed)
Patient notified of the results of the breath test.  Patient is asking if you feel there is any benefit to being treated the second week of xifaxan with a antifungal.  He read that there may be a benefit in the case there could be overgrowth of fungus as well as bacterial .

## 2018-02-02 NOTE — Telephone Encounter (Signed)
Patient notified

## 2018-02-02 NOTE — Telephone Encounter (Signed)
Information sent to [facility] to do the prior authorization for xifaxan that the patient needs.

## 2018-02-08 NOTE — Telephone Encounter (Signed)
Patient calling back in regards of this asking if Mcarthur Rossetti is still in process to be appoved best call back [phone].

## 2018-02-08 NOTE — Telephone Encounter (Signed)
I spoke with [facility] and they are hoping to file the prior authorization today. So hopefully we will know something this week. I told [facility] that we are closed Friday. They are not.  If approved they will reach out to the patient to set up delivery.  I informed Domique of the status of all this.

## 2018-02-09 NOTE — Telephone Encounter (Signed)
Received fax today that the [place] has been approved from 02/09/18 thru 02/23/18. I contacted [facility] and they too received a fax with this news. They are going to reach out to [place] to see about delivery. I called and told Cartrell to be on the look out for the call. He said Thank you.

## 2018-02-22 ENCOUNTER — Telehealth: Payer: Self-pay | Admitting: Internal Medicine

## 2018-02-22 NOTE — Telephone Encounter (Signed)
Pt has questions regarding treatment.

## 2018-02-22 NOTE — Telephone Encounter (Signed)
Dennis Aguilar reports he is improved but still symptoms of burping and bloating. He has 5 days worth of the xifaxan left. He wants to know if he needs retesting and he wants to know what his #'s were on his breath test, specially what was elevated. He has been reading up on the SIBO and these questions have come up. He is requesting a call back from Dr Carlean Purl if possible.

## 2018-02-25 NOTE — Telephone Encounter (Signed)
I did reach him and discussed things  He feels somewhat better but still w/ burping moreso than bloating  Does not finish abx til tomorrow  Advised wait 2 weeks and call back w/ sx update and will consider next steps - re tx vs retest - probably a retx most likely if still w/ sig sxs

## 2018-02-25 NOTE — Telephone Encounter (Signed)
Please tell the patient I am working at the hospital and I have not had access to his SIBO report Ask him to hold on - I will try to get to him tomorrow but cannot guarantee that

## 2018-02-25 NOTE — Telephone Encounter (Signed)
I spoke with Saralyn Pilar and he is aware of the situation.

## 2018-02-25 NOTE — Telephone Encounter (Signed)
Patient calling back regarding this. Best # [phone]

## 2018-03-04 ENCOUNTER — Other Ambulatory Visit: Payer: Self-pay | Admitting: Orthopedic Surgery

## 2018-03-04 DIAGNOSIS — M25512 Pain in left shoulder: Secondary | ICD-10-CM

## 2018-03-04 DIAGNOSIS — M25311 Other instability, right shoulder: Secondary | ICD-10-CM | POA: Diagnosis not present

## 2018-03-05 ENCOUNTER — Telehealth: Payer: Self-pay | Admitting: Internal Medicine

## 2018-03-05 NOTE — Telephone Encounter (Signed)
Spoke with patient, wants follow-up before July.  Scheduled with Anderson Malta on 03/16/18 at 2:15 pm

## 2018-03-07 ENCOUNTER — Ambulatory Visit
Admission: RE | Admit: 2018-03-07 | Discharge: 2018-03-07 | Disposition: A | Discharge status: Home / Self Care | Payer: BLUE CROSS/BLUE SHIELD | Source: Ambulatory Visit | Attending: Orthopedic Surgery | Admitting: Orthopedic Surgery

## 2018-03-07 DIAGNOSIS — M19012 Primary osteoarthritis, left shoulder: Secondary | ICD-10-CM | POA: Diagnosis not present

## 2018-03-07 DIAGNOSIS — M25512 Pain in left shoulder: Secondary | ICD-10-CM

## 2018-03-11 DIAGNOSIS — M25311 Other instability, right shoulder: Secondary | ICD-10-CM | POA: Diagnosis not present

## 2018-03-13 ENCOUNTER — Ambulatory Visit
Admission: RE | Admit: 2018-03-13 | Discharge: 2018-03-13 | Disposition: A | Discharge status: Home / Self Care | Payer: BLUE CROSS/BLUE SHIELD | Source: Ambulatory Visit | Attending: Orthopedic Surgery | Admitting: Orthopedic Surgery

## 2018-03-13 DIAGNOSIS — M25512 Pain in left shoulder: Secondary | ICD-10-CM | POA: Diagnosis not present

## 2018-03-15 DIAGNOSIS — M25311 Other instability, right shoulder: Secondary | ICD-10-CM | POA: Diagnosis not present

## 2018-03-16 ENCOUNTER — Ambulatory Visit: Payer: BLUE CROSS/BLUE SHIELD | Admitting: Physician Assistant

## 2018-03-17 ENCOUNTER — Ambulatory Visit: Payer: BLUE CROSS/BLUE SHIELD | Admitting: Physician Assistant

## 2018-03-17 ENCOUNTER — Encounter: Payer: Self-pay | Admitting: Physician Assistant

## 2018-03-17 VITALS — BP 132/78 | HR 72 | Ht 70.25 in | Wt 267.8 lb

## 2018-03-17 DIAGNOSIS — K6389 Other specified diseases of intestine: Secondary | ICD-10-CM | POA: Diagnosis not present

## 2018-03-17 MED ORDER — RIFAXIMIN 550 MG PO TABS: 550.0000 mg | ORAL_TABLET | Freq: Three times a day (TID) | ORAL | 0 refills | Status: DC

## 2018-03-17 NOTE — Patient Instructions (Signed)
You have been referred for a small intestine bacterial overgrowth test (SIBO) which is completed by a company named [facility]. Your demographic and insurance information has been sent to this company and they should be in contact with you over the next week regarding this test. Please keep in mind that you will be getting this call from phone number [phone] or a similar number. If you do not hear from them within this time frame, please call our office at [phone].    We have sent the following medications to your pharmacy for you to pick up at your convenience: Xifaxan 550 mg three times a day for 2 weeks. Do not start this until after you have completed your breath test.   Align probiotic once you finish with your antibiotic.

## 2018-03-17 NOTE — Progress Notes (Addendum)
Chief Complaint: Follow-up SIBO  HPI:    [name] is a 42 year old Caucasian male with a past medical history as listed below, who follows with Dr. Carlean Purl and returns clinic today for follow-up of his suspected SIBO.    01/18/2018 office visit with complaints of bloating and belching.  At that time SIBO was thought possible and patient had a lactulose hydrogen breath test.  This was positive and patient was treated with Xifaxan 500 mg 3 times daily x2 weeks.    Today, patient explains that on the second week of his antibiotics all of his symptoms did seem to get better, he has been off of antibiotics now for 3 weeks and everything has slowly started to come back.  He continues to report bloating and belching and some problems with certain foods that are not on the "FODMAP diet".  Patient would like to be retested for SIBO and retreated with Xifaxan if this is positive.    Denies fever, chills, blood in his stool, any new symptoms or concerns.  Past Medical History:  Diagnosis Date  . Anxiety   . GERD (gastroesophageal reflux disease)   . High cholesterol   . Hypertension   . 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 HAND SURGERY Right   . KNEE ARTHROSCOPY WITH LATERAL MENISECTOMY Left     Current Outpatient Medications  Medication Sig Dispense Refill  . Multiple Vitamin (MULTIVITAMIN) tablet Take 1 tablet by mouth daily.    . rifaximin (XIFAXAN) 550 MG TABS tablet Take 1 tablet (550 mg total) by mouth 3 (three) times daily. 42 tablet 0   No current facility-administered medications for this visit.     Allergies as of 03/17/2018  . (No Known Allergies)    Family History  Problem Relation Age of Onset  . Heart disease Father   . Heart attack Maternal Grandfather   . Heart disease Paternal Grandfather   . Dementia Paternal Grandfather   . Colon cancer Paternal Grandfather 28    . Esophageal cancer Neg Hx   . Stomach cancer Neg Hx   . Pancreatic cancer Neg Hx   . Liver disease Neg Hx     Social History   Socioeconomic History  . Marital status: Married    Spouse name: Not on file  . Number of children: 6  . Years of education: Not on file  . Highest education level: Not on file  Occupational History  . Occupation: Teacher, adult education  Social Needs  . Financial resource strain: Not on file  . Food insecurity:    Worry: Not on file    Inability: Not on file  . Transportation needs:    Medical: Not on file    Non-medical: Not on file  Tobacco Use  . Smoking status: Never Smoker  . Smokeless tobacco: Former Systems developer    Types: Chew  Substance and Sexual Activity  . Alcohol use: Yes    Comment: occasinal  . Drug use: Never  . Sexual activity: Not on file  Lifestyle  . Physical activity:    Days per week: Not on file    Minutes per session: Not on file  . Stress: Not on file  Relationships  . Social connections:    Talks on phone: Not on file    Gets together: Not on file    Attends religious service: Not on file  Active member of club or organization: Not on file    Attends meetings of clubs or organizations: Not on file    Relationship status: Not on file  . Intimate partner violence:    Fear of current or ex partner: Not on file    Emotionally abused: Not on file    Physically abused: Not on file    Forced sexual activity: Not on file  Other Topics Concern  . Not on file  Social History Narrative   The patient is married with 6 sons as of 2019 they range in age from 1-13   He is a Teacher, adult education   2 caffeinated beverages daily   No smoking tobacco or drug use or alcohol    Review of Systems:    Constitutional: No weight loss, fever or chills Cardiovascular: No chest pain  Respiratory: No SOB  Gastrointestinal: See HPI and otherwise negative   Physical Exam:  Vital signs: BP 132/78   Pulse 72   Ht 5' 10.25" (1.784 m)   Wt 267 lb 12.8 oz  (121.5 kg)   BMI 38.15 kg/m   Constitutional:   Pleasant overweight Caucasian male appears to be in NAD, Well developed, Well nourished, alert and cooperative Respiratory: Respirations even and unlabored. Lungs clear to auscultation bilaterally.   No wheezes, crackles, or rhonchi.  Cardiovascular: Normal S1, S2. No MRG. Regular rate and rhythm. No peripheral edema, cyanosis or pallor.  Gastrointestinal:  Soft, nondistended, nontender. No rebound or guarding. Normal bowel sounds. No appreciable masses or hepatomegaly. Psychiatric: Demonstrates good judgement and reason without abnormal affect or behaviors.  See HPI for recent labs.  Assessment: 1.  SIBO: Positive testing 01/25/2018, finished 2 weeks of Xifaxan, did feel some better week 2, now continues with bloating/belching 3 weeks after tx; continue to consider SIBO vs IBS vs other  Plan: 1.  We will retest patient with lactulose hydrogen breath testing per his request.  If this is positive we will retreat with Xifaxan.  Did discuss case with Dr. Carlean Purl.  Will make sure patient does not need to wait another few weeks after recently being tested/treated before retesting. 2.  Discussed with patient that he could try taking Align after he is retreated with antibiotics in the future. 3.  Patient continues to follow the FODMAP diet "for the most part". 4.  If testing is negative or patient continues with symptoms could consider EGD for further evaluation per Dr. Carlean Purl 5.  Patient to follow in clinic as directed after testing above.  Ellouise Newer, PA-C [facility] 03/17/2018, 3:14 PM  Cc: Josem Kaufmann, MD   Agree with Ms. Krishav Mamone's evaluation and management.  Gatha Mayer, MD, [facility]

## 2018-03-19 ENCOUNTER — Encounter: Payer: Self-pay | Admitting: Internal Medicine

## 2018-03-19 DIAGNOSIS — R140 Abdominal distension (gaseous): Secondary | ICD-10-CM | POA: Diagnosis not present

## 2018-03-19 DIAGNOSIS — K6389 Other specified diseases of intestine: Secondary | ICD-10-CM | POA: Diagnosis not present

## 2018-03-19 DIAGNOSIS — R142 Eructation: Secondary | ICD-10-CM | POA: Diagnosis not present

## 2018-03-19 LAB — PULMONARY FUNCTION TEST

## 2018-03-23 ENCOUNTER — Telehealth: Payer: Self-pay | Admitting: Physician Assistant

## 2018-03-23 NOTE — Telephone Encounter (Signed)
Patient states insurance is not wanting to cover his antibiotic again and would like to know if a prior Dennis Aguilar has been received or if he needs something else. Pt had ov on 5.22.19.

## 2018-03-29 MED ORDER — RIFAXIMIN 550 MG PO TABS: 550.0000 mg | ORAL_TABLET | Freq: Three times a day (TID) | ORAL | 0 refills | Status: DC

## 2018-03-29 NOTE — Telephone Encounter (Signed)
Spoke to patient and informed him that I have sent Rx to [facility] and they will contact him. Apologized for the delay as I was out of the office and just returned today.

## 2018-03-30 ENCOUNTER — Encounter: Payer: Self-pay | Admitting: Internal Medicine

## 2018-03-30 DIAGNOSIS — M25312 Other instability, left shoulder: Secondary | ICD-10-CM | POA: Diagnosis not present

## 2018-03-30 NOTE — Telephone Encounter (Signed)
This encounter was created in error - please disregard.

## 2018-03-31 ENCOUNTER — Telehealth: Payer: Self-pay

## 2018-03-31 NOTE — Telephone Encounter (Signed)
-----   Message from Levin Erp, [place] sent at 03/31/2018  8:50 AM EDT ----- Regarding: FW: breath test negative See Dr. Celesta Aver note. Thanks-jll  ----- Message ----- From: Gatha Mayer, MD Sent: 03/30/2018   6:24 PM To: Wyline Beady, CMA, # Subject: breath test negative                           Please let him know breath test was negative  He does not need retreatment with Xifaxan  I think scheduling EGD for bloating and dyspepsia makes sense

## 2018-03-31 NOTE — Telephone Encounter (Signed)
Spoke to patient and informed him of results. He requested results be faxed to him. I did send him a code to generate MyChart so that he would be able to see results. He states he is working with a dietician in regards to his SIBO testing and they need to see the results as well. I told him that we could fax the records over to them if necessary.   Patient is aware that it is recommended he schedule an EGD but would like to call back to schedule.

## 2018-03-31 NOTE — Telephone Encounter (Signed)
Dennis Aguilar called the pt see alternate note

## 2018-04-01 ENCOUNTER — Telehealth: Payer: Self-pay | Admitting: Cardiology

## 2018-04-01 NOTE — Telephone Encounter (Signed)
Received records from [facility] on 03/31/18, Appt 05/04/18 @ 9:20PM.AM

## 2018-04-09 ENCOUNTER — Encounter: Payer: Self-pay | Admitting: Physician Assistant

## 2018-05-04 ENCOUNTER — Ambulatory Visit: Payer: BLUE CROSS/BLUE SHIELD | Admitting: Cardiology

## 2018-05-11 ENCOUNTER — Ambulatory Visit: Payer: BLUE CROSS/BLUE SHIELD | Admitting: Allergy and Immunology

## 2018-05-11 ENCOUNTER — Encounter: Payer: Self-pay | Admitting: Allergy and Immunology

## 2018-05-11 VITALS — BP 142/86 | HR 68 | Temp 98.2°F | Resp 20 | Ht 70.8 in | Wt 268.2 lb

## 2018-05-11 DIAGNOSIS — J3089 Other allergic rhinitis: Secondary | ICD-10-CM | POA: Diagnosis not present

## 2018-05-11 DIAGNOSIS — Z91018 Allergy to other foods: Secondary | ICD-10-CM | POA: Diagnosis not present

## 2018-05-11 NOTE — Assessment & Plan Note (Addendum)
Gastrointestinal symptoms, uncertain etiology. Skin tests to select food allergens were negative today. The negative predictive value of food allergen skin testing is excellent (approximately 95%). While this does not appear to be an IgE mediated issue, skin testing does not rule out food intolerances or cell-mediated enteropathies which may lend to GI symptoms. These etiologies are suggested when elimination of the responsible food leads to symptom resolution and re-introduction of the food is followed by the return of symptoms.   The patient has been encouraged to keep a careful symptom/food journal and eliminate any food suspected of correlating with symptoms. Should symptoms concerning for anaphylaxis arise, 911 is to be called immediately.  If GI symptoms persist or progress, further gastroenterologist evaluation may be warranted. 

## 2018-05-11 NOTE — Progress Notes (Signed)
New Patient Note  RE: [name] MRN: [id number] DOB: 1976-09-01 Date of Office Visit: 05/11/2018  Referring provider: Josem Kaufmann, MD Primary care provider: Josem Kaufmann, MD  Chief Complaint: Food Intolerance and Allergy Testing   History of present illness: Dennis Aguilar is a 42 y.o. male seen today in consultation requested by [name], MD.  He complains of gastrointestinal symptoms, the worst being bloating after meals.  He is unable to identify any specific foods which correlate with the bloating.  He does not experience concomitant urticaria, angioedema, or cardiopulmonary symptoms.  He has undergone extensive gastroenterology evaluation including "everything except a scope".  He was found to have small intestinal bacterial overgrowth which cleared after treatment, however his GI symptoms have persisted.  Barium swallow was negative and serologic studies were negative for celiac.  He is here today to assess food allergy status. [name] underwent several years of immunotherapy for allergic rhinitis when he was a teenager.  He experienced significant symptom reduction from the immunotherapy, however occasionally needs an over-the-counter antihistamine to help control symptoms during the springtime.  Assessment and plan: History of food allergy/GI symptoms Gastrointestinal symptoms, uncertain etiology. Skin tests to select food allergens were negative today. The negative predictive value of food allergen skin testing is excellent (approximately 95%). While this does not appear to be an IgE mediated issue, skin testing does not rule out food intolerances or cell-mediated enteropathies which may lend to GI symptoms. These etiologies are suggested when elimination of the responsible food leads to symptom resolution and re-introduction of the food is followed by the return of symptoms.   The patient has been encouraged to keep a careful symptom/food journal and eliminate any food  suspected of correlating with symptoms. Should symptoms concerning for anaphylaxis arise, 911 is to be called immediately.  If GI symptoms persist or progress, further gastroenterologist evaluation may be warranted.  Allergic rhinitis The patient's history suggests seasonal allergic rhinitis.  As his symptoms are well controlled with over-the-counter antihistamines, environmental skin testing was deferred today.  Continue appropriate allergen avoidance measures and over-the-counter antihistamines as needed.  If symptoms progress, return for further evaluation and treatment.   Diagnostics: Food allergen skin testing: Negative.    Physical examination: Blood pressure (!) 142/86, pulse 68, temperature 98.2 F (36.8 C), temperature source Oral, resp. rate 20, height 5' 10.8" (1.798 m), weight 268 lb 3.2 oz (121.7 kg), SpO2 96 %.  General: Alert, interactive, in no acute distress. HEENT: TMs pearly gray, turbinates mildly edematous without discharge, post-pharynx mildly erythematous. Neck: Supple without lymphadenopathy. Lungs: Clear to auscultation without wheezing, rhonchi or rales. CV: Normal S1, S2 without murmurs. Abdomen: Nondistended, nontender. Skin: Warm and dry, without lesions or rashes. Extremities:  No clubbing, cyanosis or edema. Neuro:   Grossly intact.  Review of systems:  Review of systems negative except as noted in HPI / PMHx or noted below: Review of Systems  Constitutional: Negative.   HENT: Negative.   Eyes: Negative.   Respiratory: Negative.   Cardiovascular: Negative.   Gastrointestinal: Negative.   Genitourinary: Negative.   Musculoskeletal: Negative.   Skin: Negative.   Neurological: Negative.   Endo/Heme/Allergies: Negative.   Psychiatric/Behavioral: Negative.     Past medical history:  Past Medical History:  Diagnosis Date  . Anxiety   . GERD (gastroesophageal reflux disease)   . High cholesterol   . Hypertension   . Recurrent upper  respiratory infection (URI)   . 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 HAND SURGERY Right   . KNEE ARTHROSCOPY WITH LATERAL MENISECTOMY Left     Family history: Family History  Problem Relation Age of Onset  . Urticaria Mother   . Heart disease Father   . Allergic rhinitis Father   . Urticaria Sister   . Heart attack Maternal Grandfather   . Heart disease Paternal Grandfather   . Dementia Paternal Grandfather   . Colon cancer Paternal Grandfather 39  . Esophageal cancer Neg Hx   . Stomach cancer Neg Hx   . Pancreatic cancer Neg Hx   . Liver disease Neg Hx   . Asthma Neg Hx   . Eczema Neg Hx     Social history: Social History   Socioeconomic History  . Marital status: Married    Spouse name: Not on file  . Number of children: 6  . Years of education: Not on file  . Highest education level: Not on file  Occupational History  . Occupation: Teacher, adult education  Social Needs  . Financial resource strain: Not on file  . Food insecurity:    Worry: Not on file    Inability: Not on file  . Transportation needs:    Medical: Not on file    Non-medical: Not on file  Tobacco Use  . Smoking status: Never Smoker  . Smokeless tobacco: Former Systems developer    Types: Chew  Substance and Sexual Activity  . Alcohol use: Yes    Comment: occasinal  . Drug use: Never  . Sexual activity: Not on file  Lifestyle  . Physical activity:    Days per week: Not on file    Minutes per session: Not on file  . Stress: Not on file  Relationships  . Social connections:    Talks on phone: Not on file    Gets together: Not on file    Attends religious service: Not on file    Active member of club or organization: Not on file    Attends meetings of clubs or organizations: Not on file    Relationship status: Not on file  . Intimate partner violence:    Fear of current or ex  partner: Not on file    Emotionally abused: Not on file    Physically abused: Not on file    Forced sexual activity: Not on file  Other Topics Concern  . Not on file  Social History Narrative   The patient is married with 6 sons as of 2019 they range in age from 1-13   He is a Teacher, adult education   2 caffeinated beverages daily   No smoking tobacco or drug use or alcohol   Environmental History: Patient lives in a house with hardwood floors throughout the gas heat, and central air.  2 dogs at home which do not have access to his bedroom.  There is no known mold/water damage in the home.  He is a non-smoker.  Allergies as of 05/11/2018   No Known Allergies     Medication List        Accurate as of 05/11/18 10:18 AM. Always use your most recent med list.          multivitamin tablet Take 1 tablet by mouth daily.       Known medication allergies: No Known Allergies  I appreciate the opportunity to take part in Renley's care. Please do  not hesitate to contact me with questions.  Sincerely,   R. Edgar Frisk, MD

## 2018-05-11 NOTE — Patient Instructions (Addendum)
History of food allergy/GI symptoms Gastrointestinal symptoms, uncertain etiology. Skin tests to select food allergens were negative today. The negative predictive value of food allergen skin testing is excellent (approximately 95%). While this does not appear to be an IgE mediated issue, skin testing does not rule out food intolerances or cell-mediated enteropathies which may lend to GI symptoms. These etiologies are suggested when elimination of the responsible food leads to symptom resolution and re-introduction of the food is followed by the return of symptoms.   The patient has been encouraged to keep a careful symptom/food journal and eliminate any food suspected of correlating with symptoms. Should symptoms concerning for anaphylaxis arise, 911 is to be called immediately.  If GI symptoms persist or progress, further gastroenterologist evaluation may be warranted.  Allergic rhinitis The patient's history suggests seasonal allergic rhinitis.  As his symptoms are well controlled with over-the-counter antihistamines, environmental skin testing was deferred today.  Continue appropriate allergen avoidance measures and over-the-counter antihistamines as needed.  If symptoms progress, return for further evaluation and treatment.

## 2018-05-11 NOTE — Assessment & Plan Note (Signed)
The patient's history suggests seasonal allergic rhinitis.  As his symptoms are well controlled with over-the-counter antihistamines, environmental skin testing was deferred today.  Continue appropriate allergen avoidance measures and over-the-counter antihistamines as needed.  If symptoms progress, return for further evaluation and treatment.

## 2018-06-15 DIAGNOSIS — M25522 Pain in left elbow: Secondary | ICD-10-CM | POA: Diagnosis not present

## 2018-06-15 DIAGNOSIS — M25312 Other instability, left shoulder: Secondary | ICD-10-CM | POA: Diagnosis not present

## 2018-06-22 ENCOUNTER — Ambulatory Visit: Payer: BLUE CROSS/BLUE SHIELD | Admitting: Physician Assistant

## 2018-06-22 ENCOUNTER — Encounter: Payer: Self-pay | Admitting: Physician Assistant

## 2018-06-22 VITALS — BP 134/90 | HR 80 | Ht 70.0 in | Wt 262.8 lb

## 2018-06-22 DIAGNOSIS — R194 Change in bowel habit: Secondary | ICD-10-CM | POA: Diagnosis not present

## 2018-06-22 DIAGNOSIS — R140 Abdominal distension (gaseous): Secondary | ICD-10-CM

## 2018-06-22 NOTE — Progress Notes (Addendum)
Chief Complaint: Bloating, change in bowel habits  HPI:    Dennis Aguilar is a 42 year old Caucasian male with a past medical history as listed below, follows with Dr. Carlean Purl and returns clinic today with a continued complaint of bloating and change in bowel habits.    03/17/2018 office visit with me following up on SIBO.  Patient previously complained of bloating and belching at that time SIBO test was positive.  He was treated with Xifaxan 500 mg 3 times daily x2 weeks.  At that follow-up appointment patient explained that for the first 2 weeks after antibiotics everything was better but it has been starting to come back.  He asked to be retreated with antibiotics.  He wanted to be retested as well.  Repeat hydrogen breath test was negative and patient was never given another round of antibiotics.    Today, the patient tells me that all of his symptoms have continued over the past 3 months.  He did speak with a nutritionist via phone in [place] and did a stool test and tells me that he was diagnosed with Candida overgrowth.  They told him he needed to follow with a gastroenterologist for further information.  Patient continues with a bloating immediately after eating as well as an excessive amount of burping.  He describes that the symptoms will last for 7 to 8 hours after he eats a meal.  He has followed with a nutritionist and altered his diet and had no change in his symptoms.  Continues with a change in bowel habits noting that they are just smaller and more irregular than previous.  Vague history of having abdominal x-rays in the past which showed constipation the patient has never tried laxatives. He has had these symptoms for 3 years now and is getting worried.    Denies fever, chills, blood in his stool, melena or weight loss.   Past Medical History:  Diagnosis Date  . Anxiety   . GERD (gastroesophageal reflux disease)   . High cholesterol   . Hypertension   . Recurrent upper respiratory  infection (URI)   . 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 HAND SURGERY Right   . KNEE ARTHROSCOPY WITH LATERAL MENISECTOMY Left     Current Outpatient Medications  Medication Sig Dispense Refill  . Multiple Vitamin (MULTIVITAMIN) tablet Take 1 tablet by mouth daily.     No current facility-administered medications for this visit.     Allergies as of 06/22/2018  . (No Known Allergies)    Family History  Problem Relation Age of Onset  . Urticaria Mother   . Heart disease Father   . Allergic rhinitis Father   . Urticaria Sister   . Heart attack Maternal Grandfather   . Heart disease Paternal Grandfather   . Dementia Paternal Grandfather   . Colon cancer Paternal Grandfather 54  . Esophageal cancer Neg Hx   . Stomach cancer Neg Hx   . Pancreatic cancer Neg Hx   . Liver disease Neg Hx   . Asthma Neg Hx   . Eczema Neg Hx     Social History   Socioeconomic History  . Marital status: Married    Spouse name: Not on file  . Number of children: 6  . Years of education: Not on file  . Highest education level: Not on file  Occupational History  . 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 Financial resource strain: Not on file  . Food insecurity:    Worry: Not on file    Inability: Not on file  . Transportation needs:    Medical: Not on file    Non-medical: Not on file  Tobacco Use  . Smoking status: Never Smoker  . Smokeless tobacco: Former Systems developer    Types: Chew  Substance and Sexual Activity  . Alcohol use: Yes    Comment: occasinal  . Drug use: Never  . Sexual activity: Not on file  Lifestyle  . Physical activity:    Days per week: Not on file    Minutes per session: Not on file  . Stress: Not on file  Relationships  . Social connections:    Talks on phone: Not on file    Gets together: Not on file    Attends religious service: Not on file    Active  member of club or organization: Not on file    Attends meetings of clubs or organizations: Not on file    Relationship status: Not on file  . Intimate partner violence:    Fear of current or ex partner: Not on file    Emotionally abused: Not on file    Physically abused: Not on file    Forced sexual activity: Not on file  Other Topics Concern  . Not on file  Social History Narrative   The patient is married with 6 sons as of 2019 they range in age from 1-13   He is a Teacher, adult education   2 caffeinated beverages daily   No smoking tobacco or drug use or alcohol    Review of Systems:    Constitutional: No weight loss, fever or chills Cardiovascular: No chest pain Respiratory: No SOB  Gastrointestinal: See HPI and otherwise negative   Physical Exam:  Vital signs: BP 134/90   Pulse 80   Ht 5\' 10"  (1.778 m)   Wt 262 lb 12.8 oz (119.2 kg)   BMI 37.71 kg/m   Constitutional:   Pleasant Caucasian male appears to be in NAD, Well developed, Well nourished, alert and cooperative Respiratory: Respirations even and unlabored. Lungs clear to auscultation bilaterally.   No wheezes, crackles, or rhonchi.  Cardiovascular: Normal S1, S2. No MRG. Regular rate and rhythm. No peripheral edema, cyanosis or pallor.  Gastrointestinal:  Soft, nondistended, nontender. No rebound or guarding. Normal bowel sounds. No appreciable masses or hepatomegaly. Psychiatric: Demonstrates good judgement and reason without abnormal affect or behaviors.  No recent labs or imaging.  Assessment: 1.  Bloating: Continues, initially better after treatment of SIBO, but now symptoms are back, diagnosis of Candida overgrowth via physician consultation over the phone in [place] and a stool sample; consider IBS versus dyspepsia versus gastritis versus other 2.  Change in bowel habits: Towards smaller with a change in color and consistency; consider relation to IBS versus SIBO versus other  Plan: 1.  Scheduled patient for  diagnostic EGD and colonoscopy with Dr. Carlean Purl in the [facility]  Did discuss risk, benefits, limitations and alternatives the patient agrees to proceed. 2.  Patient is very concerned in regards to next steps in treatment.  I discussed at length how having these procedures were to give [place] further information as to what we can do next to help him. 3.  Patient to follow in clinic per recommendations from Dr. Carlean Purl after time of procedures.  Would recommend that he see him in clinic at follow-up.  Ellouise Newer, PA-C [facility] 06/22/2018, 2:50 PM  Cc: Josem Kaufmann, MD   Agree with Ms. Jannet Calip's evaluation and management. Persistent functional-type GI sxs after successful SIBO Tx Candida overgrowth is not something I am certain is a disease and not something I treat Gatha Mayer, MD, [facility]

## 2018-06-22 NOTE — Patient Instructions (Signed)
You have been scheduled for an endoscopy and colonoscopy. Please follow the written instructions given to you at your visit today. Please pick up your prep supplies at the pharmacy within the next 1-3 days. If you use inhalers (even only as needed), please bring them with you on the day of your procedure. Your physician has requested that you go to www.startemmi.com and enter the access code given to you at your visit today. This web site gives a general overview about your procedure. However, you should still follow specific instructions given to you by our office regarding your preparation for the procedure.  Thank you for choosing me and [facility].   Ellouise Newer, PA-C

## 2018-06-24 DIAGNOSIS — B360 Pityriasis versicolor: Secondary | ICD-10-CM | POA: Diagnosis not present

## 2018-06-24 DIAGNOSIS — L814 Other melanin hyperpigmentation: Secondary | ICD-10-CM | POA: Diagnosis not present

## 2018-08-12 ENCOUNTER — Encounter: Payer: Self-pay | Admitting: Internal Medicine

## 2018-08-12 ENCOUNTER — Ambulatory Visit (AMBULATORY_SURGERY_CENTER): Payer: BLUE CROSS/BLUE SHIELD | Admitting: Internal Medicine

## 2018-08-12 VITALS — BP 119/80 | HR 78 | Temp 98.7°F | Resp 17 | Ht 70.0 in | Wt 262.0 lb

## 2018-08-12 DIAGNOSIS — K635 Polyp of colon: Secondary | ICD-10-CM | POA: Diagnosis not present

## 2018-08-12 DIAGNOSIS — R1013 Epigastric pain: Secondary | ICD-10-CM

## 2018-08-12 DIAGNOSIS — D122 Benign neoplasm of ascending colon: Secondary | ICD-10-CM

## 2018-08-12 DIAGNOSIS — D124 Benign neoplasm of descending colon: Secondary | ICD-10-CM

## 2018-08-12 DIAGNOSIS — R140 Abdominal distension (gaseous): Secondary | ICD-10-CM

## 2018-08-12 DIAGNOSIS — R194 Change in bowel habit: Secondary | ICD-10-CM

## 2018-08-12 DIAGNOSIS — K219 Gastro-esophageal reflux disease without esophagitis: Secondary | ICD-10-CM

## 2018-08-12 DIAGNOSIS — K209 Esophagitis, unspecified: Secondary | ICD-10-CM | POA: Diagnosis not present

## 2018-08-12 DIAGNOSIS — K2970 Gastritis, unspecified, without bleeding: Secondary | ICD-10-CM | POA: Diagnosis not present

## 2018-08-12 DIAGNOSIS — Z1211 Encounter for screening for malignant neoplasm of colon: Secondary | ICD-10-CM | POA: Diagnosis not present

## 2018-08-12 MED ORDER — SODIUM CHLORIDE 0.9 % IV SOLN: 500.0000 mL | Freq: Once | INTRAVENOUS | Status: DC

## 2018-08-12 NOTE — Op Note (Signed)
[place] Patient Name: Dennis Aguilar Procedure Date: 08/12/2018 2:31 PM MRN: [id number] Endoscopist: Gatha Mayer , MD Age: 42 Referring MD:  Date of Birth: 06-05-1976 Gender: Male Account #: [id number] Procedure:                Upper GI endoscopy Indications:              Abdominal bloating, Dyspepsia Medicines:                Propofol per Anesthesia, Monitored Anesthesia Care Procedure:                Pre-Anesthesia Assessment:                           - Prior to the procedure, a History and Physical                            was performed, and patient medications and                            allergies were reviewed. The patient's tolerance of                            previous anesthesia was also reviewed. The risks                            and benefits of the procedure and the sedation                            options and risks were discussed with the patient.                            All questions were answered, and informed consent                            was obtained. Prior Anticoagulants: The patient has                            taken no previous anticoagulant or antiplatelet                            agents. ASA Grade Assessment: II - A patient with                            mild systemic disease. After reviewing the risks                            and benefits, the patient was deemed in                            satisfactory condition to undergo the procedure.                           After obtaining informed consent, the endoscope was  passed under direct vision. Throughout the                            procedure, the patient's blood pressure, pulse, and                            oxygen saturations were monitored continuously. The                            Endoscope was introduced through the mouth, and                            advanced to the second part of duodenum. The upper                            GI  endoscopy was accomplished without difficulty.                            The patient tolerated the procedure well. Scope In: Scope Out: Findings:                 LA Grade B (one or more mucosal breaks greater than                            5 mm, not extending between the tops of two mucosal                            folds) esophagitis with no bleeding was found in                            the distal esophagus. Biopsies were taken with a                            cold forceps for histology. Verification of patient                            identification for the specimen was done. Estimated                            blood loss was minimal.                           The entire examined stomach was normal. Biopsies                            were taken with a cold forceps for histology.                            Verification of patient identification for the                            specimen was done. Estimated blood loss was minimal.  The examined duodenum was normal. Biopsies were                            taken with a cold forceps for histology.                            Verification of patient identification for the                            specimen was done. Estimated blood loss was minimal.                           The cardia and gastric fundus were normal on                            retroflexion. Complications:            No immediate complications. Estimated Blood Loss:     Estimated blood loss was minimal. Impression:               - LA Grade B reflux esophagitis. Biopsied.                           - Normal stomach. Biopsied.                           - Normal examined duodenum. Biopsied. Recommendation:           - Patient has a contact number available for                            emergencies. The signs and symptoms of potential                            delayed complications were discussed with the                             patient. Return to normal activities tomorrow.                            Written discharge instructions were provided to the                            patient.                           - See the other procedure note for documentation of                            additional recommendations.                           - Await pathology results.                           - Once all path reviewed will make treatment plans                            -  will need acid suppression to trreat GERD                           Colonoscopy next Gatha Mayer, MD 08/12/2018 3:30:02 PM This report has been signed electronically.

## 2018-08-12 NOTE — Progress Notes (Signed)
Called to room to assist during endoscopic procedure.  Patient ID and intended procedure confirmed with present staff. Received instructions for my participation in the procedure from the performing physician.  

## 2018-08-12 NOTE — Patient Instructions (Addendum)
Here is what I found:  1) Esophagitis from acid reflux 2) 2 small colon polyps - removed 3) All else looked normal - biopsies from stomach and duodenum taken  Once biopsies are back I will recommend next steps - you are going to need treatment for acid reflux but I want to see all results before prescribing  I appreciate the opportunity to care for you. Gatha Mayer, MD, [facility]  Continue present medications. Please read handouts provided. Await pathology results.   YOU HAD AN ENDOSCOPIC PROCEDURE TODAY AT [facility]:   Refer to the procedure report that was given to you for any specific questions about what was found during the examination.  If the procedure report does not answer your questions, please call your gastroenterologist to clarify.  If you requested that your care partner not be given the details of your procedure findings, then the procedure report has been included in a sealed envelope for you to review at your convenience later.  YOU SHOULD EXPECT: Some feelings of bloating in the abdomen. Passage of more gas than usual.  Walking can help get rid of the air that was put into your GI tract during the procedure and reduce the bloating. If you had a lower endoscopy (such as a colonoscopy or flexible sigmoidoscopy) you may notice spotting of blood in your stool or on the toilet paper. If you underwent a bowel prep for your procedure, you may not have a normal bowel movement for a few days.  Please Note:  You might notice some irritation and congestion in your nose or some drainage.  This is from the oxygen used during your procedure.  There is no need for concern and it should clear up in a day or so.  SYMPTOMS TO REPORT IMMEDIATELY:   Following lower endoscopy (colonoscopy or flexible sigmoidoscopy):  Excessive amounts of blood in the stool  Significant tenderness or worsening of abdominal pains  Swelling of the abdomen that is new, acute  Fever of  100F or higher   Following upper endoscopy (EGD)  Vomiting of blood or coffee ground material  New chest pain or pain under the shoulder blades  Painful or persistently difficult swallowing  New shortness of breath  Fever of 100F or higher  Black, tarry-looking stools  For urgent or emergent issues, a gastroenterologist can be reached at any hour by calling [phone].   DIET:  We do recommend a small meal at first, but then you may proceed to your regular diet.  Drink plenty of fluids but you should avoid alcoholic beverages for 24 hours.  ACTIVITY:  You should plan to take it easy for the rest of today and you should NOT DRIVE or use heavy machinery until tomorrow (because of the sedation medicines used during the test).    FOLLOW UP: Our staff will call the number listed on your records the next business day following your procedure to check on you and address any questions or concerns that you may have regarding the information given to you following your procedure. If we do not reach you, we will leave a message.  However, if you are feeling well and you are not experiencing any problems, there is no need to return our call.  We will assume that you have returned to your regular daily activities without incident.  If any biopsies were taken you will be contacted by phone or by letter within the next 1-3 weeks.  Please call  us at [phone] if you have not heard about the biopsies in 3 weeks.    SIGNATURES/CONFIDENTIALITY: You and/or your care partner have signed paperwork which will be entered into your electronic medical record.  These signatures attest to the fact that that the information above on your After Visit Summary has been reviewed and is understood.  Full responsibility of the confidentiality of this discharge information lies with you and/or your care-partner.

## 2018-08-12 NOTE — Progress Notes (Signed)
To PACU, VSS. Report to [email or web address] 

## 2018-08-12 NOTE — Op Note (Signed)
[place] Patient Name: Dennis Aguilar Procedure Date: 08/12/2018 2:31 PM MRN: [id number] Endoscopist: Gatha Mayer , MD Age: 42 Referring MD:  Date of Birth: [birth year] Gender: Male Account #: [id number] Procedure:                Colonoscopy Indications:              Change in bowel habits Medicines:                Propofol per Anesthesia, Monitored Anesthesia Care Procedure:                Pre-Anesthesia Assessment:                           - Prior to the procedure, a History and Physical                            was performed, and patient medications and                            allergies were reviewed. The patient's tolerance of                            previous anesthesia was also reviewed. The risks                            and benefits of the procedure and the sedation                            options and risks were discussed with the patient.                            All questions were answered, and informed consent                            was obtained. Prior Anticoagulants: The patient has                            taken no previous anticoagulant or antiplatelet                            agents. ASA Grade Assessment: II - A patient with                            mild systemic disease. After reviewing the risks                            and benefits, the patient was deemed in                            satisfactory condition to undergo the procedure.                           After obtaining informed consent, the colonoscope  was passed under direct vision. Throughout the                            procedure, the patient's blood pressure, pulse, and                            oxygen saturations were monitored continuously. The                            Colonoscope was introduced through the anus and                            advanced to the the terminal ileum, with                            identification of the  appendiceal orifice and IC                            valve. The colonoscopy was performed without                            difficulty. The patient tolerated the procedure                            well. The quality of the bowel preparation was                            excellent. The terminal ileum, ileocecal valve,                            appendiceal orifice, and rectum were photographed. Scope In: 2:59:10 PM Scope Out: 3:14:53 PM Scope Withdrawal Time: 0 hours 13 minutes 44 seconds  Total Procedure Duration: 0 hours 15 minutes 43 seconds  Findings:                 The perianal and digital rectal examinations were                            normal. Pertinent negatives include normal prostate                            (size, shape, and consistency).                           The terminal ileum appeared normal.                           Two sessile polyps were found in the descending                            colon and ascending colon. The polyps were                            diminutive in size. These polyps were removed with  a cold snare. Resection and retrieval were                            complete. Verification of patient identification                            for the specimen was done. Estimated blood loss was                            minimal.                           The exam was otherwise without abnormality on                            direct and retroflexion views. Complications:            No immediate complications. Estimated Blood Loss:     Estimated blood loss was minimal. Impression:               - The examined portion of the ileum was normal.                           - Two diminutive polyps in the descending colon and                            in the ascending colon, removed with a cold snare.                            Resected and retrieved.                           - The examination was otherwise normal on direct                             and retroflexion views. Recommendation:           - Patient has a contact number available for                            emergencies. The signs and symptoms of potential                            delayed complications were discussed with the                            patient. Return to normal activities tomorrow.                            Written discharge instructions were provided to the                            patient.                           - Resume previous diet.                           -  Continue present medications.                           - Repeat colonoscopy is recommended. The                            colonoscopy date will be determined after pathology                            results from today's exam become available for                            review.                           Await pathology review of GEGD biopsies also Gatha Mayer, MD 08/12/2018 3:32:40 PM This report has been signed electronically.

## 2018-08-13 ENCOUNTER — Telehealth: Payer: Self-pay

## 2018-08-13 NOTE — Telephone Encounter (Signed)
  Follow up Call-  Call back number 08/12/2018  Post procedure Call Back phone  # [phone]  Permission to leave phone message Yes  Some recent data might be hidden     Patient questions:  Do you have a fever, pain , or abdominal swelling? no Pain Score  0 *  Have you tolerated food without any problems? Yes.    Have you been able to return to your normal activities? Yes.    Do you have any questions about your discharge instructions: Diet   No. Medications  No. Follow up visit  No.  Do you have questions or concerns about your Care? No.  Actions: * If pain score is 4 or above: No action needed, pain <4.

## 2018-08-23 ENCOUNTER — Encounter: Payer: Self-pay | Admitting: Internal Medicine

## 2018-08-23 DIAGNOSIS — Z860100 Personal history of colon polyps, unspecified: Secondary | ICD-10-CM | POA: Insufficient documentation

## 2018-08-23 DIAGNOSIS — K219 Gastro-esophageal reflux disease without esophagitis: Secondary | ICD-10-CM | POA: Insufficient documentation

## 2018-08-23 HISTORY — DX: Personal history of colonic polyps: Z86.010

## 2018-08-23 HISTORY — DX: Personal history of colon polyps, unspecified: Z86.0100

## 2018-08-23 NOTE — Progress Notes (Signed)
[facility] - 5 year colon recall, no letter (I called patient)  Office  1) esomeprazole 40 mg po daily before breakfast # 90 1 refill 2) Schedule gastric emptying scan - 4 hours re: GERD, belching, regurgitation

## 2018-08-24 ENCOUNTER — Other Ambulatory Visit: Payer: Self-pay

## 2018-08-24 DIAGNOSIS — K219 Gastro-esophageal reflux disease without esophagitis: Secondary | ICD-10-CM

## 2018-08-24 DIAGNOSIS — B360 Pityriasis versicolor: Secondary | ICD-10-CM | POA: Diagnosis not present

## 2018-08-24 MED ORDER — ESOMEPRAZOLE MAGNESIUM 40 MG PO CPDR: 40.0000 mg | DELAYED_RELEASE_CAPSULE | Freq: Every day | ORAL | 1 refills | Status: DC

## 2018-08-24 NOTE — Addendum Note (Signed)
Addended by: Marlon Pel on: 08/24/2018 02:26 PM   Modules accepted: Orders

## 2018-09-13 DIAGNOSIS — H9222 Otorrhagia, left ear: Secondary | ICD-10-CM | POA: Diagnosis not present

## 2018-09-20 ENCOUNTER — Ambulatory Visit (HOSPITAL_COMMUNITY)
Admission: RE | Admit: 2018-09-20 | Discharge: 2018-09-20 | Disposition: A | Discharge status: Home / Self Care | Payer: BLUE CROSS/BLUE SHIELD | Source: Ambulatory Visit | Attending: Internal Medicine | Admitting: Internal Medicine

## 2018-09-20 DIAGNOSIS — R140 Abdominal distension (gaseous): Secondary | ICD-10-CM | POA: Diagnosis not present

## 2018-09-20 DIAGNOSIS — K219 Gastro-esophageal reflux disease without esophagitis: Secondary | ICD-10-CM | POA: Diagnosis not present

## 2018-09-20 MED ORDER — FLUDEOXYGLUCOSE F - 18 (FDG) INJECTION
2.0300 | Freq: Once | INTRAVENOUS | Status: AC | PRN
  Administered 2018-09-20: 2.03 via INTRAVENOUS

## 2018-09-22 NOTE — Progress Notes (Signed)
Normal gastric emptying study My Chart message

## 2018-09-28 NOTE — Progress Notes (Signed)
Can we tell if he has read the My Chart message?

## 2018-10-12 ENCOUNTER — Telehealth: Payer: Self-pay | Admitting: Internal Medicine

## 2018-10-12 NOTE — Telephone Encounter (Signed)
I spoke with his pharmacy and the Nexium is non-formulary, they prefer omeprazole 40mg . Please advise Sir , thank you.

## 2018-10-13 MED ORDER — OMEPRAZOLE 40 MG PO CPDR: 40.0000 mg | DELAYED_RELEASE_CAPSULE | Freq: Every day | ORAL | 1 refills | Status: DC

## 2018-10-13 NOTE — Telephone Encounter (Signed)
Omeprazole 40 mg po qd 30 mins before breakfast is fine # 90 1 refill

## 2018-10-13 NOTE — Telephone Encounter (Signed)
I informed Dennis Aguilar and he said thank you. New rx sent in and he will let us know if he has any problems with this.

## 2018-11-09 DIAGNOSIS — E60 Dietary zinc deficiency: Secondary | ICD-10-CM | POA: Diagnosis not present

## 2018-11-09 DIAGNOSIS — E7212 Methylenetetrahydrofolate reductase deficiency: Secondary | ICD-10-CM | POA: Diagnosis not present

## 2018-11-09 DIAGNOSIS — R5383 Other fatigue: Secondary | ICD-10-CM | POA: Diagnosis not present

## 2018-11-09 DIAGNOSIS — R6882 Decreased libido: Secondary | ICD-10-CM | POA: Diagnosis not present

## 2018-11-09 DIAGNOSIS — E559 Vitamin D deficiency, unspecified: Secondary | ICD-10-CM | POA: Diagnosis not present

## 2018-11-09 DIAGNOSIS — E538 Deficiency of other specified B group vitamins: Secondary | ICD-10-CM | POA: Diagnosis not present

## 2018-11-09 DIAGNOSIS — K589 Irritable bowel syndrome without diarrhea: Secondary | ICD-10-CM | POA: Diagnosis not present

## 2018-12-15 DIAGNOSIS — K589 Irritable bowel syndrome without diarrhea: Secondary | ICD-10-CM | POA: Diagnosis not present

## 2018-12-15 DIAGNOSIS — E538 Deficiency of other specified B group vitamins: Secondary | ICD-10-CM | POA: Diagnosis not present

## 2018-12-15 DIAGNOSIS — E559 Vitamin D deficiency, unspecified: Secondary | ICD-10-CM | POA: Diagnosis not present

## 2018-12-15 DIAGNOSIS — R5383 Other fatigue: Secondary | ICD-10-CM | POA: Diagnosis not present

## 2019-02-25 DIAGNOSIS — R5383 Other fatigue: Secondary | ICD-10-CM | POA: Diagnosis not present

## 2019-02-25 DIAGNOSIS — E039 Hypothyroidism, unspecified: Secondary | ICD-10-CM | POA: Diagnosis not present

## 2019-02-25 DIAGNOSIS — E7212 Methylenetetrahydrofolate reductase deficiency: Secondary | ICD-10-CM | POA: Diagnosis not present

## 2019-02-25 DIAGNOSIS — E559 Vitamin D deficiency, unspecified: Secondary | ICD-10-CM | POA: Diagnosis not present

## 2019-02-25 DIAGNOSIS — E785 Hyperlipidemia, unspecified: Secondary | ICD-10-CM | POA: Diagnosis not present

## 2019-02-25 DIAGNOSIS — E538 Deficiency of other specified B group vitamins: Secondary | ICD-10-CM | POA: Diagnosis not present

## 2019-02-25 DIAGNOSIS — N5089 Other specified disorders of the male genital organs: Secondary | ICD-10-CM | POA: Diagnosis not present

## 2019-02-25 DIAGNOSIS — K589 Irritable bowel syndrome without diarrhea: Secondary | ICD-10-CM | POA: Diagnosis not present

## 2019-03-17 DIAGNOSIS — N5089 Other specified disorders of the male genital organs: Secondary | ICD-10-CM | POA: Diagnosis not present

## 2019-03-17 DIAGNOSIS — B379 Candidiasis, unspecified: Secondary | ICD-10-CM | POA: Diagnosis not present

## 2019-03-17 DIAGNOSIS — E063 Autoimmune thyroiditis: Secondary | ICD-10-CM | POA: Diagnosis not present

## 2019-03-17 DIAGNOSIS — E612 Magnesium deficiency: Secondary | ICD-10-CM | POA: Diagnosis not present

## 2019-03-28 IMAGING — MR MR CHEST MEDIASTINUM W/O CM
7 series · 16 of 16 positions shown · non-contrast
Comparison: Left shoulder MRI 03/07/2018.

CLINICAL DATA: Left shoulder and scapula pain for 5 years. No acute
injury or prior relevant surgery.

EXAM:
MRI BRACHIAL PLEXUS WITHOUT CONTRAST
TECHNIQUE: Multiplanar, multiecho pulse sequences of the neck and surrounding
structures were obtained without intravenous contrast. The field of
view was focused on the leftbrachial plexus from the neural foramina
to the axilla.

[Series 4: T1 · axial · 4.0mm · 0.70mm/px · z∈[-208,+10]mm · 2 of 40 slices shown]
[im 1/40]
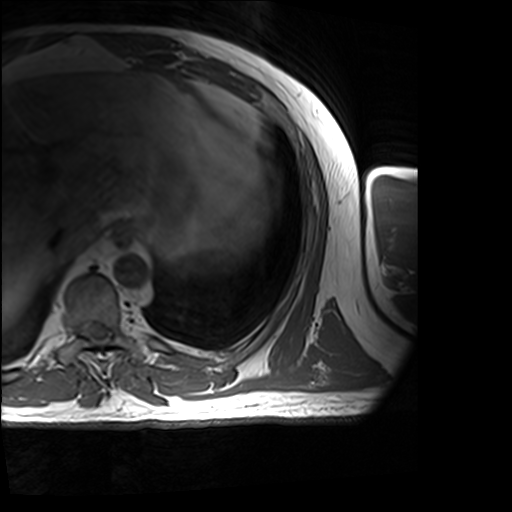
[im 40/40]
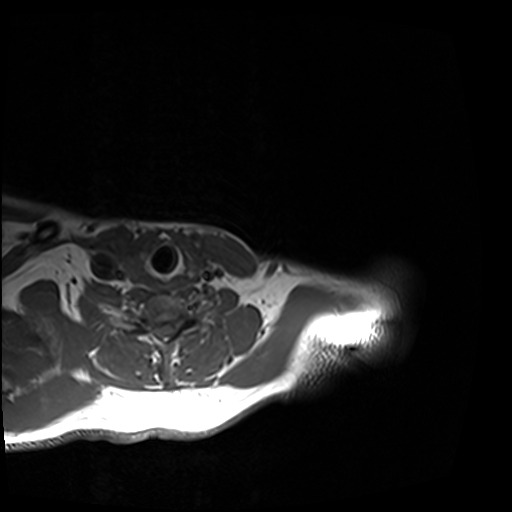

[Series 5: T2 fat-sat · axial · 4.0mm · 0.70mm/px · z∈[-208,+10]mm · 2 of 40 slices shown]
[im 1/40]
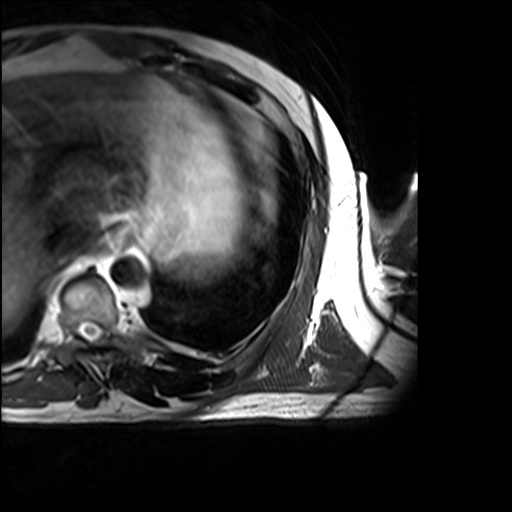
[im 40/40]
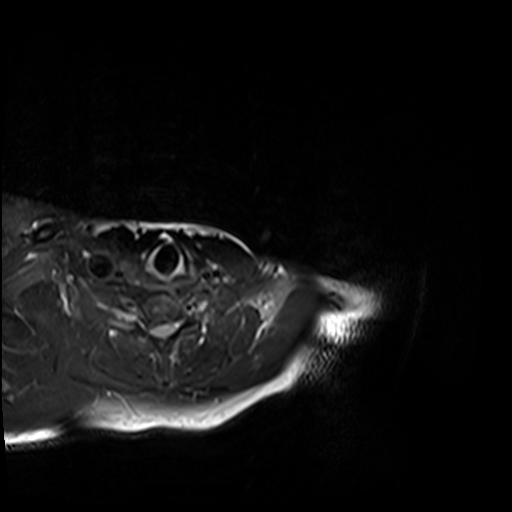

[Series 6: t1_tse_sag · oblique · 4.0mm · 1.41mm/px · 2 of 40 slices shown]
[im 1/40]
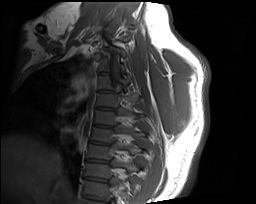
[im 40/40]
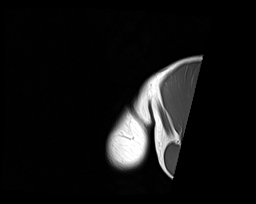

[Series 7: STIR · oblique · 4.0mm · 0.70mm/px · 3 of 40 slices shown (1 of 3)]
[im 1/40]
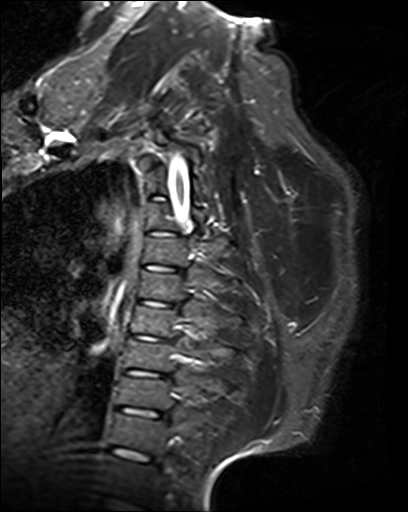
[im 20/40]
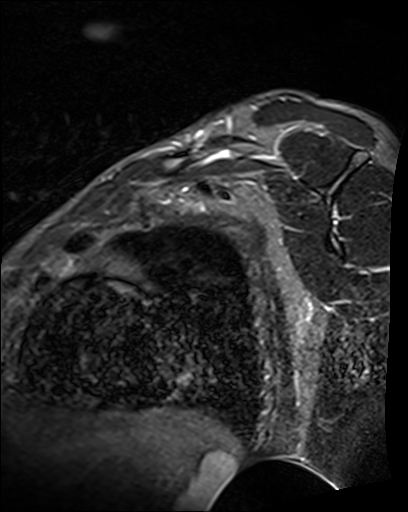
[im 40/40]
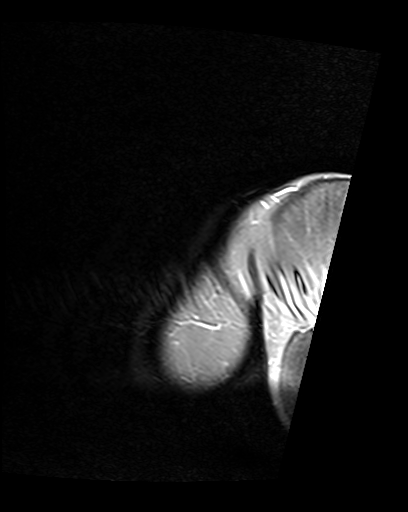

[Series 8: STIR · oblique · 4.0mm · 0.70mm/px · 2 of 26 slices shown (2 of 3)]
[im 1/26]
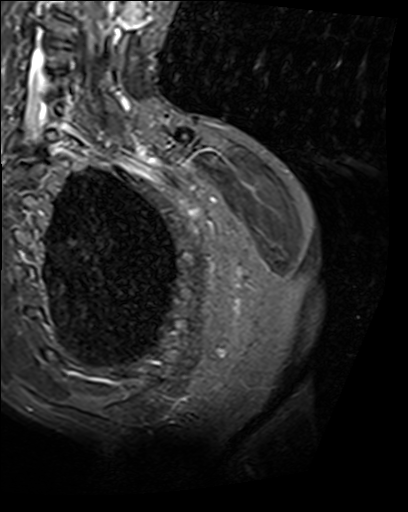
[im 26/26]
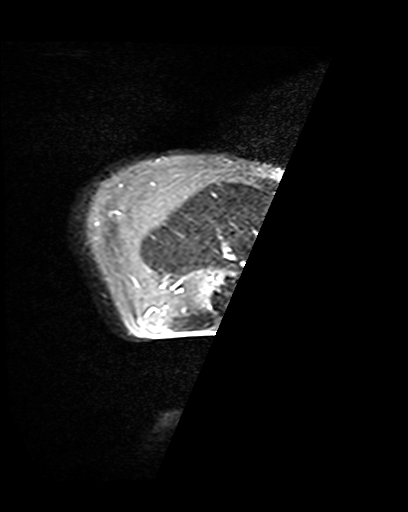

[Series 9: STIR · axial · 4.0mm · 0.70mm/px · z∈[-203,+15]mm · 3 of 40 slices shown (3 of 3)]
[im 1/40]
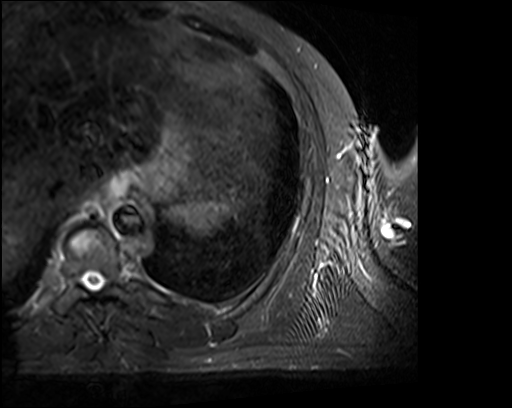
[im 20/40]
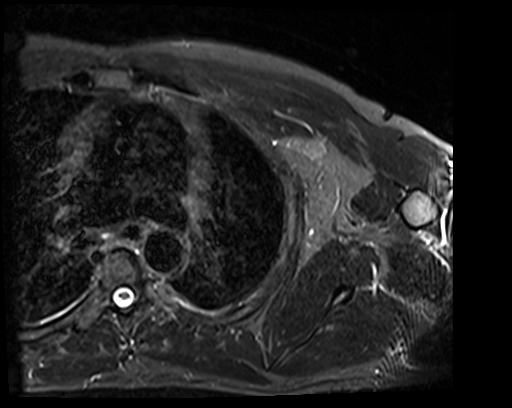
[im 40/40]
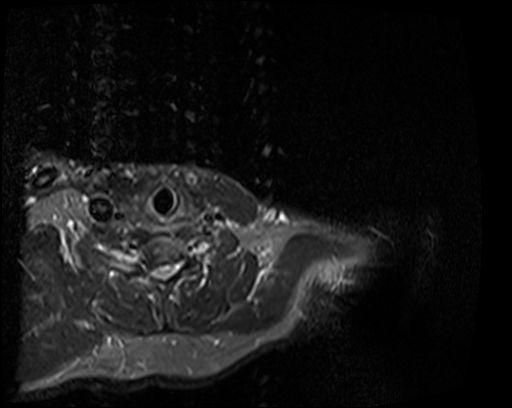

[Series 10: t1_tse_cor · oblique · 4.0mm · 1.41mm/px · 2 of 26 slices shown]
[im 1/26]
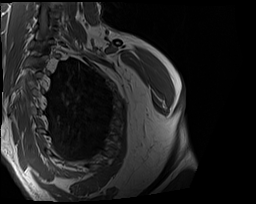
[im 26/26]
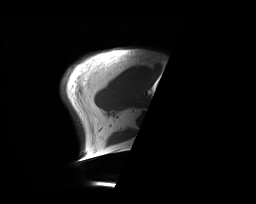

[16 of 16 positions shown; findings below may reference images not displayed]

FINDINGS: Spinal cord

Normal in signal and caliber.

Brachial plexus:

No abnormality of the brachial plexus or immediate surrounding soft
tissues identified.

Muscles and tendons

Normal in signal and caliber.  No focal muscular atrophy or edema.

Bones

Unremarkable.

Joints

Age advanced acromioclavicular degenerative changes as seen on
recent shoulder MRI. No other significant arthropathic changes
identified.

Other findings

The visualized mediastinum appears normal.
IMPRESSION: 1. No abnormality of the left brachial plexus or surrounding soft
tissues identified.
2. No focal muscular atrophy or edema within the upper left chest
wall.

## 2019-06-08 DIAGNOSIS — R5383 Other fatigue: Secondary | ICD-10-CM | POA: Diagnosis not present

## 2019-06-08 DIAGNOSIS — E559 Vitamin D deficiency, unspecified: Secondary | ICD-10-CM | POA: Diagnosis not present

## 2019-06-08 DIAGNOSIS — E039 Hypothyroidism, unspecified: Secondary | ICD-10-CM | POA: Diagnosis not present

## 2019-06-08 DIAGNOSIS — E785 Hyperlipidemia, unspecified: Secondary | ICD-10-CM | POA: Diagnosis not present

## 2019-06-08 DIAGNOSIS — N5089 Other specified disorders of the male genital organs: Secondary | ICD-10-CM | POA: Diagnosis not present

## 2019-06-13 ENCOUNTER — Other Ambulatory Visit: Payer: Self-pay

## 2019-06-13 DIAGNOSIS — Z20822 Contact with and (suspected) exposure to covid-19: Secondary | ICD-10-CM

## 2019-06-15 LAB — NOVEL CORONAVIRUS, NAA: SARS-CoV-2, NAA: NOT DETECTED

## 2019-06-20 ENCOUNTER — Other Ambulatory Visit: Payer: Self-pay

## 2019-06-20 DIAGNOSIS — Z20822 Contact with and (suspected) exposure to covid-19: Secondary | ICD-10-CM

## 2019-06-20 DIAGNOSIS — R6889 Other general symptoms and signs: Secondary | ICD-10-CM | POA: Diagnosis not present

## 2019-06-21 LAB — NOVEL CORONAVIRUS, NAA: SARS-CoV-2, NAA: NOT DETECTED

## 2019-06-23 DIAGNOSIS — B379 Candidiasis, unspecified: Secondary | ICD-10-CM | POA: Diagnosis not present

## 2019-06-23 DIAGNOSIS — E60 Dietary zinc deficiency: Secondary | ICD-10-CM | POA: Diagnosis not present

## 2019-06-23 DIAGNOSIS — N5089 Other specified disorders of the male genital organs: Secondary | ICD-10-CM | POA: Diagnosis not present

## 2019-06-23 DIAGNOSIS — E612 Magnesium deficiency: Secondary | ICD-10-CM | POA: Diagnosis not present

## 2019-09-12 DIAGNOSIS — U071 COVID-19: Secondary | ICD-10-CM | POA: Diagnosis not present

## 2019-09-23 DIAGNOSIS — Z20828 Contact with and (suspected) exposure to other viral communicable diseases: Secondary | ICD-10-CM | POA: Diagnosis not present

## 2019-09-23 DIAGNOSIS — Z9189 Other specified personal risk factors, not elsewhere classified: Secondary | ICD-10-CM | POA: Diagnosis not present

## 2020-01-20 ENCOUNTER — Ambulatory Visit: Payer: BC Managed Care – PPO

## 2020-08-20 ENCOUNTER — Telehealth: Payer: Self-pay | Admitting: Nurse Practitioner

## 2020-08-20 DIAGNOSIS — Z20822 Contact with and (suspected) exposure to covid-19: Secondary | ICD-10-CM

## 2020-08-20 NOTE — Telephone Encounter (Signed)
Returned patient's call regarding monoclonal antibodies. His son has tested positive for mAB and they have been inclose contact. Patient is asymptomatic and has not yet been tested. We discussed indication for mAB, quarantine procedures, testing. mAB not indicated at this time as he is asymptomatic and has not been tested. Discussed that supply of mAB at this time does not allow for prophylaxis/subcutaneous administration. Patient will call back if needed.

## 2020-08-24 ENCOUNTER — Telehealth (HOSPITAL_COMMUNITY): Payer: Self-pay

## 2020-08-24 ENCOUNTER — Ambulatory Visit (HOSPITAL_COMMUNITY)
Admission: RE | Admit: 2020-08-24 | Discharge: 2020-08-24 | Disposition: A | Discharge status: Home / Self Care | Payer: BC Managed Care – PPO | Source: Ambulatory Visit | Attending: Pulmonary Disease | Admitting: Pulmonary Disease

## 2020-08-24 ENCOUNTER — Other Ambulatory Visit: Payer: Self-pay | Admitting: Infectious Diseases

## 2020-08-24 DIAGNOSIS — I10 Essential (primary) hypertension: Secondary | ICD-10-CM | POA: Diagnosis present

## 2020-08-24 DIAGNOSIS — U071 COVID-19: Secondary | ICD-10-CM

## 2020-08-24 DIAGNOSIS — E669 Obesity, unspecified: Secondary | ICD-10-CM | POA: Insufficient documentation

## 2020-08-24 MED ORDER — METHYLPREDNISOLONE SODIUM SUCC 125 MG IJ SOLR: 125.0000 mg | Freq: Once | INTRAMUSCULAR | Status: DC | PRN

## 2020-08-24 MED ORDER — SODIUM CHLORIDE 0.9 % IV SOLN: INTRAVENOUS | Status: DC | PRN

## 2020-08-24 MED ORDER — DIPHENHYDRAMINE HCL 50 MG/ML IJ SOLN: 50.0000 mg | Freq: Once | INTRAMUSCULAR | Status: DC | PRN

## 2020-08-24 MED ORDER — FAMOTIDINE IN NACL 20-0.9 MG/50ML-% IV SOLN: 20.0000 mg | Freq: Once | INTRAVENOUS | Status: DC | PRN

## 2020-08-24 MED ORDER — EPINEPHRINE 0.3 MG/0.3ML IJ SOAJ: 0.3000 mg | Freq: Once | INTRAMUSCULAR | Status: DC | PRN

## 2020-08-24 MED ORDER — ALBUTEROL SULFATE HFA 108 (90 BASE) MCG/ACT IN AERS: 2.0000 | INHALATION_SPRAY | Freq: Once | RESPIRATORY_TRACT | Status: DC | PRN

## 2020-08-24 MED ORDER — SODIUM CHLORIDE 0.9 % IV SOLN: Freq: Once | INTRAVENOUS | Status: AC

## 2020-08-24 NOTE — Telephone Encounter (Signed)
See RN note pre screen for further details.   Scheduled for appt today at 2:30. Emailed results to [place].  MyChart sent with details about appointment.    Janene Madeira, MSN, NP-C [facility] [email or web address] Pager: [phone] Office: [phone] [place]: [phone]

## 2020-08-24 NOTE — Discharge Instructions (Signed)

## 2020-08-24 NOTE — Progress Notes (Signed)
  Diagnosis: COVID-19  Physician: Dr Asencion Noble  Procedure: [facility] Med: bamlanivimab\etesevimab infusion - Provided patient with bamlanimivab\etesevimab fact sheet for patients, parents and caregivers prior to infusion.  Complications: No immediate complications noted.  Discharge: Discharged home   Heide Scales 08/24/2020

## 2020-08-24 NOTE — Progress Notes (Signed)
I connected by phone with Dennis Aguilar on 08/24/2020 at 12:43 PM to discuss the potential use of a new treatment for mild to moderate COVID-19 viral infection in non-hospitalized patients.  This patient is a 44 y.o. male that meets the FDA criteria for Emergency Use Authorization of COVID monoclonal antibody casirivimab/imdevimab or bamlanivimab/eteseviamb.  Has a (+) direct SARS-CoV-2 viral test result  Has mild or moderate COVID-19   Is NOT hospitalized due to COVID-19  Is within 10 days of symptom onset  Has at least one of the high risk factor(s) for progression to severe COVID-19 and/or hospitalization as defined in EUA.  Specific high risk criteria : BMI > 25, Diabetes and Cardiovascular disease or hypertension   I have spoken and communicated the following to the patient or parent/caregiver regarding COVID monoclonal antibody treatment:  1. FDA has authorized the emergency use for the treatment of mild to moderate COVID-19 in adults and pediatric patients with positive results of direct SARS-CoV-2 viral testing who are 21 years of age and older weighing at least 40 kg, and who are at high risk for progressing to severe COVID-19 and/or hospitalization.  2. The significant known and potential risks and benefits of COVID monoclonal antibody, and the extent to which such potential risks and benefits are unknown.  3. Information on available alternative treatments and the risks and benefits of those alternatives, including clinical trials.  4. Patients treated with COVID monoclonal antibody should continue to self-isolate and use infection control measures (e.g., wear mask, isolate, social distance, avoid sharing personal items, clean and disinfect "high touch" surfaces, and frequent handwashing) according to CDC guidelines.   5. The patient or parent/caregiver has the option to accept or refuse COVID monoclonal antibody treatment.  After reviewing this information with the patient,  the patient has agreed to receive one of the available covid 19 monoclonal antibodies and will be provided an appropriate fact sheet prior to infusion. Janene Madeira, NP 08/24/2020 12:43 PM

## 2020-08-24 NOTE — Telephone Encounter (Signed)
Called to Discuss with patient about Covid symptoms and the use of the monoclonal antibody infusion for those with mild to moderate Covid symptoms and at a high risk of hospitalization.     Pt appears to qualify for this infusion due to co-morbid conditions and/or a member of an at-risk group in accordance with the FDA Emergency Use Authorization.    Pt pre-screened by RN and appears to qualify for monoclonal antibody infusion. Pt's info referred to APP for further discussion/scheduling.   Pt began having COVID symptoms on 08/21/20. Positive test 08/23/20 at [facility]. Risk factors include hypertension, high cholesterol, BMI

## 2021-08-15 ENCOUNTER — Institutional Professional Consult (permissible substitution): Payer: BC Managed Care – PPO | Admitting: Neurology

## 2021-10-16 ENCOUNTER — Institutional Professional Consult (permissible substitution): Payer: BC Managed Care – PPO | Admitting: Neurology

## 2022-01-13 ENCOUNTER — Other Ambulatory Visit (HOSPITAL_COMMUNITY): Payer: Self-pay | Admitting: Internal Medicine

## 2022-01-13 DIAGNOSIS — E785 Hyperlipidemia, unspecified: Secondary | ICD-10-CM

## 2022-01-22 ENCOUNTER — Ambulatory Visit (HOSPITAL_COMMUNITY)
Admission: RE | Admit: 2022-01-22 | Discharge: 2022-01-22 | Disposition: A | Discharge status: Home / Self Care | Payer: Self-pay | Source: Ambulatory Visit | Attending: Internal Medicine | Admitting: Internal Medicine

## 2022-01-22 DIAGNOSIS — E785 Hyperlipidemia, unspecified: Secondary | ICD-10-CM | POA: Insufficient documentation

## 2023-01-26 ENCOUNTER — Encounter: Payer: Self-pay | Admitting: Podiatry

## 2023-01-26 ENCOUNTER — Ambulatory Visit: Payer: BC Managed Care – PPO | Admitting: Podiatry

## 2023-01-26 DIAGNOSIS — Z79899 Other long term (current) drug therapy: Secondary | ICD-10-CM | POA: Diagnosis not present

## 2023-01-26 DIAGNOSIS — M79675 Pain in left toe(s): Secondary | ICD-10-CM

## 2023-01-26 DIAGNOSIS — M79674 Pain in right toe(s): Secondary | ICD-10-CM

## 2023-01-26 DIAGNOSIS — B351 Tinea unguium: Secondary | ICD-10-CM

## 2023-01-26 MED ORDER — TERBINAFINE HCL 250 MG PO TABS: 250.0000 mg | ORAL_TABLET | Freq: Every day | ORAL | 0 refills | Status: DC

## 2023-01-26 NOTE — Progress Notes (Signed)
Subjective:   Patient ID: Dennis Aguilar, male   DOB: 47 y.o.   MRN: [id number]   HPI Patient presents with a discolored left big toenail distal two thirds that he has tried medicines on and has been present for approximately 3 years.  Does not remember specific injury does not smoke likes to be active   Review of Systems  All other systems reviewed and are negative.       Objective:  Physical Exam Vitals and nursing note reviewed.  Constitutional:      Appearance: He is well-developed.  Pulmonary:     Effort: Pulmonary effort is normal.  Musculoskeletal:        General: Normal range of motion.  Skin:    General: Skin is warm.  Neurological:     Mental Status: He is alert.     Neurovascular status intact muscle strength found to be adequate range of motion adequate patient found to have discoloration distal two thirds nailbed left yellow like appearance with no other nailbeds which appear unhealthy at the current time.  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  He will do 45 days Lamisil I did order a blood work today and then will have laser for at least 3-5 applications

## 2023-08-05 ENCOUNTER — Encounter: Payer: Self-pay | Admitting: Internal Medicine

## 2023-08-05 ENCOUNTER — Ambulatory Visit: Payer: BC Managed Care – PPO | Admitting: Internal Medicine

## 2023-08-05 VITALS — BP 130/86 | HR 72 | Ht 70.0 in | Wt 274.0 lb

## 2023-08-05 DIAGNOSIS — R1319 Other dysphagia: Secondary | ICD-10-CM

## 2023-08-05 DIAGNOSIS — Z860100 Personal history of colon polyps, unspecified: Secondary | ICD-10-CM

## 2023-08-05 NOTE — Patient Instructions (Signed)
You have been scheduled for a Barium Esophogram at [facility] on 08/07/2023 at 10:00am. Please arrive 30 minutes prior to your appointment for registration. Make certain not to have anything to eat or drink 3 hours prior to your test. If you need to reschedule for any reason, please contact radiology at [phone] to do so. __________________________________________________________________ A barium swallow is an examination that concentrates on views of the esophagus. This tends to be a double contrast exam (barium and two liquids which, when combined, create a gas to distend the wall of the oesophagus) or single contrast (non-ionic iodine based). The study is usually tailored to your symptoms so a good history is essential. Attention is paid during the study to the form, structure and configuration of the esophagus, looking for functional disorders (such as aspiration, dysphagia, achalasia, motility and reflux) EXAMINATION You may be asked to change into a gown, depending on the type of swallow being performed. A radiologist and radiographer will perform the procedure. The radiologist will advise you of the type of contrast selected for your procedure and direct you during the exam. You will be asked to stand, sit or lie in several different positions and to hold a small amount of fluid in your mouth before being asked to swallow while the imaging is performed .In some instances you may be asked to swallow barium coated marshmallows to assess the motility of a solid food bolus. The exam can be recorded as a digital or video fluoroscopy procedure. POST PROCEDURE It will take 1-2 days for the barium to pass through your system. To facilitate this, it is important, unless otherwise directed, to increase your fluids for the next 24-48hrs and to resume your normal diet.  This test typically takes about 30 minutes to  perform. __________________________________________________________________________________  I appreciate the opportunity to care for you. Stan Head, MD, [facility]

## 2023-08-05 NOTE — Patient Instructions (Signed)
VISIT SUMMARY:  Dear Dennis Aguilar, during our recent visit, we discussed your ongoing difficulty with swallowing, recent chest tightness and shoulder pain, and your history of colon polyps. We have outlined a plan to address each of these issues, which includes further testing and potential changes to your treatment.  YOUR PLAN:  -DIFFICULTY SWALLOWING (DYSPHAGIA): This is a condition where you have trouble swallowing food or liquids. We will order a barium swallow test to better understand what's happening in your esophagus (the tube that connects your mouth to your stomach). Will not prescribe acid blocking medication at this time as it can interact with itraconazole.  -CARDIAC EVALUATION: This is an assessment of your heart's health. Given your recent episode of chest pain, we will wait for the results of your upcoming cardiology appointment and  before scheduling any procedures for your swallowing difficulty and a possible repeat colonoscopy.   Marland Kitchen

## 2023-08-05 NOTE — Progress Notes (Signed)
Dennis Aguilar 47 y.o. [birth year] [id number]  Assessment & Plan:   Encounter Diagnoses  Name Primary?   Esophageal dysphagia    Hx of colonic polyps - sessile serrated Yes       Progressive difficulty swallowing pills and food over several years, worsened recently. History of GERD. Off PPI therapy. -Order barium swallow test to evaluate esophageal structure and function. -Would resume PPI therapy but will interact with itraconazole so no. -Anticipate upper endoscopy and likely esophageal dilation, pending results of barium swallow and cardiology evaluation.  History of ssp x 2 found on colonoscopy in 2019. Current guidelines may allow for extension of screening interval. -Consider extending colonoscopy interval to 7 years, pending further discussion.   Recent episode of chest pain, evaluated in ER with negative initial workup. Pending cardiology appointment and stress test. -Defer scheduling of upper endoscopy/colonoscopy until after cardiology evaluation.          Subjective:  Gastroenterology summary: March 2019 initial visit Dennis Aguilar-suspect SIBO (bloating and belching), SIBO test with mildly elevated hydrogen excretion treated with Xifaxan with temporary relief.  Returns with APP May 2019 patient requested retesting and repeat SIBO test negative.  Patient also with a long history of "holistic medical care" and has been diagnosed with Candida overgrowth.  06/15/2018 visit with APP-EGD and colonoscopy scheduled and performed in October 2019  EGD grade B reflux esophagitis, pathology confirmed reflux, normal gastric and small bowel biopsies (bloating persisted)  Colonoscopy 2 diminutive sessile serrated polyps, otherwise normal including terminal ileum  09/20/2018-normal 2-hour gastric emptying study    -----------------------------------------------------------------------------------------  Discussed the use of AI scribe software for clinical note transcription with  the patient, who gave verbal consent to proceed.  Chief Complaint: Dysphagia  HPI 47 year old white man presents with complaints of dysphagia and a [name] history as above.     The patient presents reporting a longstanding history of dysphagia (not mentioned in prior visits), which has progressively worsened over the past five to seven years. Initially, the difficulty was limited to swallowing pills, but it has now extended to solid foods, including bread, beef, and chicken. The patient reports that the condition has significantly worsened since starting Itraconazole and a histamine type medicine for a recent diagnosis of mold exposure by a physician at [employer]. Plan is for 4 months of itraconazole. The patient has a family history of similar swallowing issues, with his father having had his throat stretched multiple times.  In addition to the swallowing difficulties, the patient also reports a recent episode of chest tightness and shoulder pain, which occurred after moving furniture. The patient sought emergency care for these symptoms, and initial workup did not suggest a cardiac cause - see below. He has an 09/21/23 cardiology appointment pending.  The patient has a history of reflux changes, for which he was previously prescribed a generic form of Nexium. However, the patient discontinued the medication after feeling better, but does not remember when.      Assessment and plan from [facility] emergency department 07/15/2023 47yoM well appearing in NAD here for HPI above. He is neuro intact and hemodynamically stable. Benign physical exam except for left shoulder pain with ROM testing. No fx/dislocations or signs of systemic infection. Benign abdomen with some mild epigastric TTP. No GI bleeds. EKG, labs, delta trop, images reassuring. Benign cardiac workup here. Patient declined toradol and GI cocktail. Non -surgical abdomen. Do not suspect  PE/dissection/aaa at this time. HEART score 3. ASX during re-eval. Discussed and  offered transfer to [facility] for cardiac obs and stress test. Patient declined and stated he will follow up with his pcm and desires discharge. PO tolerant. No NVD. Ambulatory without assistance. Well appearing. D/c NAD.    Allergies  Allergen Reactions   Penicillin G Hives   Current Meds  Medication Sig   albuterol (VENTOLIN HFA) 108 (90 Base) MCG/ACT inhaler Inhale 1 puff into the lungs as needed.   anastrozole (ARIMIDEX) 1 MG tablet anastrozole 1 mg tablet  TAKE 1/2 A TABLET ONCE PER WEEK   benazepril-hydrochlorthiazide (LOTENSIN HCT) 5-6.25 MG tablet Take 1 tablet by mouth every morning.   itraconazole (SPORANOX) 100 MG capsule Take 100 mg by mouth 2 (two) times daily.   ketoconazole (NIZORAL) 2 % cream Apply 1 Application topically 2 (two) times daily.   Multiple Vitamin (MULTIVITAMIN) tablet Take 1 tablet by mouth daily.   tadalafil (CIALIS) 5 MG tablet Take 5 mg by mouth as needed.   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   + H2 Lactulose breath test 01/25/2018   Past Surgical History:  Procedure Laterality Date   COLONOSCOPY WITH ESOPHAGOGASTRODUODENOSCOPY (EGD)  2019   HAND SURGERY Right    KNEE ARTHROSCOPY WITH LATERAL MENISECTOMY Left    Social History   Social History Narrative   The patient is married with 6 sons as of 2019 they range in age from 1-13   He is a Garment/textile technologist   2 caffeinated beverages daily   No smoking tobacco or drug use or alcohol   family history includes Allergic rhinitis in his father; Dementia in his paternal grandfather; Heart attack in his maternal grandfather; Heart disease in his father and paternal grandfather; Prostate cancer in his  paternal grandfather; Urticaria in his mother and sister.   Review of Systems As per HPI. Otherwise negative  Objective:   Physical Exam BP 130/86 (BP Location: Left Arm, Patient Position: Sitting, Cuff Size: Large)   Pulse 72   Ht 5\' 10"  (1.778 m)   Wt 274 lb (124.3 kg)   BMI 39.31 kg/m   Physical Exam   HEENT: Teeth in good repair, no oral or pharyngeal lesions, tongue normal. CHEST: Chest wall not tender, lungs clear. CARDIOVASCULAR: Heart sounds normal. ABDOMEN: Soft and non-tender.

## 2023-08-07 ENCOUNTER — Other Ambulatory Visit: Payer: Self-pay | Admitting: Internal Medicine

## 2023-08-07 ENCOUNTER — Ambulatory Visit (HOSPITAL_COMMUNITY)
Admission: RE | Admit: 2023-08-07 | Discharge: 2023-08-07 | Disposition: A | Discharge status: Home / Self Care | Payer: BC Managed Care – PPO | Source: Ambulatory Visit | Attending: Internal Medicine | Admitting: Internal Medicine

## 2023-08-07 DIAGNOSIS — Z860100 Personal history of colon polyps, unspecified: Secondary | ICD-10-CM

## 2023-08-07 DIAGNOSIS — R1319 Other dysphagia: Secondary | ICD-10-CM

## 2023-08-10 ENCOUNTER — Telehealth: Payer: Self-pay | Admitting: Cardiology

## 2023-08-10 ENCOUNTER — Telehealth: Payer: Self-pay | Admitting: Internal Medicine

## 2023-08-10 NOTE — Telephone Encounter (Signed)
Patient is calling to see if Dr. Odis Hollingshead would put in an order for him to take a stress treadmill test.

## 2023-08-10 NOTE — Telephone Encounter (Signed)
Pt states he needs to have an upper endoscopy to stretch his esophagus but GI will not schedule until he has a cardiac evaluation.  Pt states he cannot wait until  09/21/2023 when his new pt appointment has been scheduled.   Per Dr Odis Hollingshead ok to add new pt to 08/11/2023 1130am.  Pt advised of appointment date and time and verbalizes understanding.  Pt thanked Charity fundraiser for the assistance.

## 2023-08-10 NOTE — Telephone Encounter (Signed)
PT is calling to follow up about scheduling a stress test before November. Please advise.

## 2023-08-10 NOTE — Telephone Encounter (Signed)
Pt was questioning if our office could call the cardiologist office to expedite the stress test so that he could be scheduled sooner for the EGD. Pt was notified that he could call them and request a a sooner appointment or he could call his PCP to see if they could help expedite the process.  Pt verbalized understanding with all questions answered.

## 2023-08-11 ENCOUNTER — Ambulatory Visit: Payer: BC Managed Care – PPO | Attending: Cardiology | Admitting: Cardiology

## 2023-08-11 ENCOUNTER — Ambulatory Visit (INDEPENDENT_AMBULATORY_CARE_PROVIDER_SITE_OTHER): Payer: BC Managed Care – PPO

## 2023-08-11 ENCOUNTER — Encounter: Payer: Self-pay | Admitting: Cardiology

## 2023-08-11 VITALS — BP 130/96 | HR 71 | Resp 16 | Ht 70.0 in | Wt 263.0 lb

## 2023-08-11 DIAGNOSIS — R7303 Prediabetes: Secondary | ICD-10-CM

## 2023-08-11 DIAGNOSIS — E782 Mixed hyperlipidemia: Secondary | ICD-10-CM

## 2023-08-11 DIAGNOSIS — I10 Essential (primary) hypertension: Secondary | ICD-10-CM

## 2023-08-11 DIAGNOSIS — Z01810 Encounter for preprocedural cardiovascular examination: Secondary | ICD-10-CM | POA: Diagnosis not present

## 2023-08-11 DIAGNOSIS — R0602 Shortness of breath: Secondary | ICD-10-CM

## 2023-08-11 DIAGNOSIS — R0789 Other chest pain: Secondary | ICD-10-CM | POA: Diagnosis not present

## 2023-08-11 LAB — EXERCISE TOLERANCE TEST
Angina Index: 0
Duke Treadmill Score: 9
Estimated workload: 10.1
Exercise duration (min): 9 min
Exercise duration (sec): 0 s
MPHR: 172 {beats}/min
Peak HR: 157 {beats}/min
Percent HR: 91 %
RPE: 17
Rest HR: 71 {beats}/min
ST Depression (mm): 0 mm

## 2023-08-11 MED ORDER — AMLODIPINE BESYLATE 10 MG PO TABS: 10.0000 mg | ORAL_TABLET | Freq: Every day | ORAL | 0 refills | Status: DC

## 2023-08-11 NOTE — Progress Notes (Signed)
Cardiology Office Note:    Date:  08/11/2023  NAME:  Dennis Aguilar    MRN: [id number] DOB:  11-13-1975   PCP:  Aniceto Boss, MD  Former Cardiology Providers: None Primary Cardiologist:  Tessa Lerner, DO, [facility] (established care 08/11/23) Electrophysiologist:  None   Referring MD: Aniceto Boss, MD  Reason of Consult: Pre-procedure evaluation / chest tightness / shortness of breath  Chief Complaint  Patient presents with   Pre-op Exam   Shortness of Breath   New Patient (Initial Visit)    History of Present Illness:    Dennis Aguilar is a 47 y.o. Caucasian male whose past medical history and cardiovascular risk factors includes: HTN, HLD, prediabetes, obesity. He is being seen today for the evaluation of chest tightness/shortness of breath/preprocedure at the request of Aniceto Boss, MD.  Chest tightness: More noticeable over the last 2 weeks. Anterior chest wall. Not brought on by effort related activities. Does not resolve with rest. Improves with Benadryl. Intensity is 2 out of 10, nonradiating. Associated symptoms include difficulty in breathing.  No change in overall physical endurance.  Patient states that his symptoms of chest tightness/difficulty in breathing is likely secondary to taking benazepril/HCTZ and itraconazole.  Patient states that GI is recommending/requesting cardiac workup prior to endoscopy.  Consultation today was moved up for these reasons itself.  Current Medications: Current Meds  Medication Sig   albuterol (VENTOLIN HFA) 108 (90 Base) MCG/ACT inhaler Inhale 1 puff into the lungs as needed.   amLODipine (NORVASC) 10 MG tablet Take 1 tablet (10 mg total) by mouth daily.   itraconazole (SPORANOX) 100 MG capsule Take 100 mg by mouth 2 (two) times daily.   ketoconazole (NIZORAL) 2 % cream Apply 1 Application topically 2 (two) times daily.   Multiple Vitamin (MULTIVITAMIN) tablet Take 1 tablet by mouth daily.   tadalafil (CIALIS) 5 MG tablet  Take 5 mg by mouth as needed.   [DISCONTINUED] benazepril-hydrochlorthiazide (LOTENSIN HCT) 5-6.25 MG tablet Take 1 tablet by mouth every morning.     Allergies:    Penicillin g   Past Medical History: 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   + H2 Lactulose breath test 01/25/2018    Past Surgical History: Past Surgical History:  Procedure Laterality Date   COLONOSCOPY WITH ESOPHAGOGASTRODUODENOSCOPY (EGD)  2019   HAND SURGERY Right    KNEE ARTHROSCOPY WITH LATERAL MENISECTOMY Left     Social History: Social History   Tobacco Use   Smoking status: Never   Smokeless tobacco: Former    Types: Chew    Quit date: 01/19/2003  Vaping Use   Vaping status: Unknown  Substance Use Topics   Alcohol use: Yes    Comment: occasinal   Drug use: Never    Family History: Family History  Problem Relation Age of Onset   Urticaria Mother    Heart disease Father    Allergic rhinitis Father    Urticaria Sister    Heart attack Maternal Grandfather    Heart disease Paternal Grandfather    Dementia Paternal Grandfather    Prostate cancer Paternal Grandfather    Esophageal cancer Neg Hx    Stomach cancer Neg Hx    Liver disease Neg  Hx    Asthma Neg Hx    Eczema Neg Hx     ROS:   Review of Systems  Cardiovascular:  Positive for chest pain (chest tightness). Negative for claudication, irregular heartbeat, leg swelling, near-syncope, orthopnea, palpitations, paroxysmal nocturnal dyspnea and syncope.  Respiratory:  Positive for shortness of breath.   Hematologic/Lymphatic: Negative for bleeding problem.  Musculoskeletal:  Negative for muscle cramps and myalgias.  Neurological:  Negative for dizziness and light-headedness.    EKGs/Labs/Other Studies  Reviewed:   EKG Interpretation Date/Time:  Tuesday August 11 2023 11:45:33 EDT Ventricular Rate:  71 PR Interval:  136 QRS Duration:  96 QT Interval:  394 QTC Calculation: 428 R Axis:   37  Text Interpretation: Normal sinus rhythm Normal ECG When compared with ECG of 26-Apr-2016 14:24, No significant change was found Confirmed by Tessa Lerner [phone]) on 08/11/2023 11:54:43 AM    Echocardiogram: NA  Stress Testing:  NA  Coronary Calcification Report: 01/22/2022 Coronary calcium score of 0. This was 0 percentile for age-, race-,and sex-matched controls. Mild aortic atheroslclerosis.  Labs:    Latest Ref Rng & Units 04/26/2016    2:27 PM 08/09/2008    5:03 AM  CBC  WBC 4.0 - 10.5 K/uL 6.8    Hemoglobin 13.0 - 17.0 g/dL 30.8  65.7   Hematocrit 39.0 - 52.0 % 44.6  46.0   Platelets 150 - 400 K/uL 255         Latest Ref Rng & Units 04/26/2016    2:27 PM 08/09/2008    5:03 AM  BMP  Glucose 65 - 99 mg/dL 846  962   BUN 6 - 20 mg/dL 14  12   Creatinine 9.52 - 1.24 mg/dL 8.41  0.9   Sodium 324 - 145 mmol/L 138  139   Potassium 3.5 - 5.1 mmol/L 4.0  3.7   Chloride 101 - 111 mmol/L 105  102   CO2 22 - 32 mmol/L 26    Calcium 8.9 - 10.3 mg/dL 9.4        Latest Ref Rng & Units 04/26/2016    2:27 PM 08/09/2008    5:03 AM  CMP  Glucose 65 - 99 mg/dL 401  027   BUN 6 - 20 mg/dL 14  12   Creatinine 2.53 - 1.24 mg/dL 6.64  0.9   Sodium 403 - 145 mmol/L 138  139   Potassium 3.5 - 5.1 mmol/L 4.0  3.7   Chloride 101 - 111 mmol/L 105  102   CO2 22 - 32 mmol/L 26    Calcium 8.9 - 10.3 mg/dL 9.4      No results found for: "CHOL", "HDL", "LDLCALC", "LDLDIRECT", "TRIG", "CHOLHDL" No results for input(s): "LIPOA" in the last 8760 hours. No components found for: "NTPROBNP" No results for input(s): "PROBNP" in the last 8760 hours. No results for input(s): "TSH" in the last 8760 hours.  Physical Exam:    Today's Vitals   08/11/23 1139  BP: (!) 130/96  Pulse: 71  Resp: 16   SpO2: 97%  Weight: 263 lb (119.3 kg)  Height: 5\' 10"  (1.778 m)   Body mass index is 37.74 kg/m. Wt Readings from Last 3 Encounters:  08/11/23 263 lb (119.3 kg)  08/05/23 274 lb (124.3 kg)  08/12/18 262 lb (118.8 kg)    Physical Exam  Constitutional: No distress.  hemodynamically stable  Neck: No JVD present.  Cardiovascular: Normal rate, regular rhythm, S1 normal, S2 normal, intact distal pulses and normal  pulses. Exam reveals no gallop, no S3 and no S4.  No murmur heard. Pulmonary/Chest: Effort normal and breath sounds normal. No stridor. He has no wheezes. He has no rales.  Abdominal: Soft. Bowel sounds are normal. He exhibits no distension. There is no abdominal tenderness.  Musculoskeletal:        General: No edema.     Cervical back: Neck supple.  Neurological: He is alert and oriented to person, place, and time. He has intact cranial nerves (2-12).  Skin: Skin is warm and moist.     Impression & Recommendation(s):  Impression:   ICD-10-CM   1. Shortness of breath  R06.02 EKG 12-Lead    ECHOCARDIOGRAM COMPLETE    Exercise Tolerance Test    Cardiac Stress Test: Informed Consent Details: Physician/Practitioner Attestation; Transcribe to consent form and obtain patient signature    2. Chest tightness  R07.89 ECHOCARDIOGRAM COMPLETE    Exercise Tolerance Test    Cardiac Stress Test: Informed Consent Details: Physician/Practitioner Attestation; Transcribe to consent form and obtain patient signature    3. Preprocedural cardiovascular examination  Z01.810 ECHOCARDIOGRAM COMPLETE    Exercise Tolerance Test    4. Benign hypertension  I10 amLODipine (NORVASC) 10 MG tablet    ECHOCARDIOGRAM COMPLETE    Exercise Tolerance Test    5. Mixed hyperlipidemia  E78.2 ECHOCARDIOGRAM COMPLETE    Exercise Tolerance Test    6. Prediabetes  R73.03 ECHOCARDIOGRAM COMPLETE    Exercise Tolerance Test       Recommendation(s):  Shortness of breath Chest tightness Preprocedure  cardiovascular examination Patient is chest tightness predominantly noncardiac based on symptoms. EKG is nonischemic. Patient is referred to cardiology for stress test/cardiac workup prior to undergoing an endoscopy for what he describes as dysphagia. Echo will be ordered to evaluate for structural heart disease and left ventricular systolic function. Exercise treadmill stress test to evaluate for functional capacity and exercise-induced arrhythmia/ischemia Further recommendations to follow  Benign hypertension Currently on benazepril/hydrochlorothiazide for blood pressure management. Patient states that his current symptoms may be associated to this medication.  No true allergy as of now. For safety reasons I recommended him to discontinue benazepril/hydrochlorothiazide and discuss further with PCP. In the interim we will start him on amlodipine 10 mg p.o. daily for blood pressure management. Reemphasized importance of low-salt diet  Mixed hyperlipidemia Patient endorses hyperlipidemia but currently not on statin therapy based on his medication list. Recommend that he follows up with PCP for fasting lipid and direct LDL to see if medical therapy is warranted. His coronary calcium score in 2023 was 0.  Prediabetes Reemphasized importance of glycemic control.   Orders Placed:  Orders Placed This Encounter  Procedures   Cardiac Stress Test: Informed Consent Details: Physician/Practitioner Attestation; Transcribe to consent form and obtain patient signature    Order Specific Question:   Physician/Practitioner attestation of informed consent for procedure/surgical case    Answer:   I, the physician/practitioner, attest that I have discussed with the patient the benefits, risks, side effects, alternatives, likelihood of achieving goals and potential problems during recovery for the procedure that I have provided informed consent.    Order Specific Question:   Procedure    Answer:   Exercise  treadmill stress test    Order Specific Question:   Indication/Reason    Answer:   Preprocedure, chest tightness, shortness of breath   Exercise Tolerance Test    Schedule ECHO AND GXT AT VERY NEXT AVAILABLE FOR PRE-OP CARDIAC CLEARANCE FOR ENDOSCOPY  Standing Status:   Future    Number of Occurrences:   1    Standing Expiration Date:   08/10/2024    Scheduling Instructions:     Schedule ECHO AND GXT AT VERY NEXT AVAILABLE FOR PRE-OP CARDIAC CLEARANCE FOR ENDOSCOPY    Order Specific Question:   Where should this test be performed    Answer:   [facility] [facility]    Order Specific Question:   Stress with pharmacologic or treadmill ?    Answer:   Treadmill w/ exercise   EKG 12-Lead   ECHOCARDIOGRAM COMPLETE    Schedule ECHO AND GXT AT VERY NEXT AVAILABLE FOR PRE-OP CARDIAC CLEARANCE FOR ENDOSCOPY    Standing Status:   Future    Standing Expiration Date:   08/10/2024    Scheduling Instructions:     Schedule ECHO AND GXT AT VERY NEXT AVAILABLE FOR PRE-OP CARDIAC CLEARANCE FOR ENDOSCOPY    Order Specific Question:   Where should this test be performed    Answer:   [facility])    Order Specific Question:   Does the patient weigh less than or greater than 250 lbs?    Answer:   Patient weighs greater than 250 lbs    Order Specific Question:   Perflutren DEFINITY (image enhancing agent) should be administered unless hypersensitivity or allergy exist    Answer:   Administer Perflutren    Order Specific Question:   Reason for exam-Echo    Answer:   Other-Full Diagnosis List    Order Specific Question:   Full ICD-10/Reason for Exam    Answer:   Preoperative clearance [287139]    As part of medical decision making results of the EKG were reviewed independently at today's visit.   Final Medication List:    Meds ordered this encounter  Medications   amLODipine (NORVASC) 10 MG tablet    Sig: Take 1 tablet (10 mg total) by mouth daily.    Dispense:  30 tablet     Refill:  0    Medications Discontinued During This Encounter  Medication Reason   anastrozole (ARIMIDEX) 1 MG tablet    testosterone cypionate (DEPOTESTOSTERONE CYPIONATE) 200 MG/ML injection    benazepril-hydrochlorthiazide (LOTENSIN HCT) 5-6.25 MG tablet      Current Outpatient Medications:    albuterol (VENTOLIN HFA) 108 (90 Base) MCG/ACT inhaler, Inhale 1 puff into the lungs as needed., Disp: , Rfl:    amLODipine (NORVASC) 10 MG tablet, Take 1 tablet (10 mg total) by mouth daily., Disp: 30 tablet, Rfl: 0   itraconazole (SPORANOX) 100 MG capsule, Take 100 mg by mouth 2 (two) times daily., Disp: , Rfl:    ketoconazole (NIZORAL) 2 % cream, Apply 1 Application topically 2 (two) times daily., Disp: , Rfl:    Multiple Vitamin (MULTIVITAMIN) tablet, Take 1 tablet by mouth daily., Disp: , Rfl:    tadalafil (CIALIS) 5 MG tablet, Take 5 mg by mouth as needed., Disp: , Rfl:   Consent:   Informed Consent   Shared Decision Making/Informed Consent The risks [chest pain, shortness of breath, cardiac arrhythmias, dizziness, blood pressure fluctuations, myocardial infarction, stroke/transient ischemic attack, and life-threatening complications (estimated to be 1 in 10,000)], benefits (risk stratification, diagnosing coronary artery disease, treatment guidance) and alternatives of an exercise tolerance test were discussed in detail with Mr. Mayol and he agrees to proceed.     Disposition:   Follow-up in 6 months for now.  Likely as needed thereafter.  His questions  and concerns were addressed to his satisfaction. He voices understanding of the recommendations provided during this encounter.    Signed, Tessa Lerner, DO, [facility]  [facility]  [address] [place], [place] [address] 08/11/2023 12:47 PM

## 2023-08-11 NOTE — Patient Instructions (Signed)
Medication Instructions:  Your physician has recommended you make the following change in your medication:   STOP Lotensin   START Amlodipine (Norvasc) 10 mg once daily   *If you need a refill on your cardiac medications before your next appointment, please call your pharmacy*  Lab Work: None ordered If you have labs (blood work) drawn today and your tests are completely normal, you will receive your results only by: MyChart Message (if you have MyChart) OR A paper copy in the mail If you have any lab test that is abnormal or we need to change your treatment, we will call you to review the results.  Testing/Procedures: Your physician has requested that you have an exercise tolerance test. For further information please visit [email or web address] Please also follow instruction sheet, as given. (Please schedule for next available)  Your physician has requested that you have an echocardiogram. Echocardiography is a painless test that uses sound waves to create images of your heart. It provides your doctor with information about the size and shape of your heart and how well your heart's chambers and valves are working. This procedure takes approximately one hour. There are no restrictions for this procedure. Please do NOT wear cologne, perfume, aftershave, or lotions (deodorant is allowed). Please arrive 15 minutes prior to your appointment time. (Please schedule for next available)  Follow-Up: At [facility], you and your health needs are our priority.  As part of our continuing mission to provide you with exceptional heart care, we have created designated Provider Care Teams.  These Care Teams include your primary Cardiologist (physician) and Advanced Practice Providers (APPs -  Physician Assistants and Nurse Practitioners) who all work together to provide you with the care you need, when you need it.  We recommend signing up for the patient portal called "MyChart".  Sign up information  is provided on this After Visit Summary.  MyChart is used to connect with patients for Virtual Visits (Telemedicine).  Patients are able to view lab/test results, encounter notes, upcoming appointments, etc.  Non-urgent messages can be sent to your provider as well.   To learn more about what you can do with MyChart, go to [email or web address].    Your next appointment:   6 month(s)  The format for your next appointment:   In Person  Provider:   Tessa Lerner, DO {

## 2023-08-12 NOTE — Telephone Encounter (Signed)
Echocardiogram is not yet done so work-up is not complete so cannot schedule procedures at thist ime

## 2023-08-12 NOTE — Telephone Encounter (Signed)
PT stated that he has completed his EKG and stress test yesterday.  Please review and advise.

## 2023-08-12 NOTE — Telephone Encounter (Signed)
Patient called requesting to speak with nurse, patient stated that his EKG and stress test is on Mychart now. Please advise.

## 2023-08-12 NOTE — Telephone Encounter (Signed)
Pt was notified of Dr. Leone Payor recommendations: Pt was notified to contact us when Echocardiogram is complete.  Pt verbalized understanding with all questions answered.

## 2023-08-14 ENCOUNTER — Telehealth: Payer: Self-pay | Admitting: Cardiology

## 2023-08-14 NOTE — Telephone Encounter (Signed)
Pt states he is trying to have a surgery done and wants to know where he might can go to get an echo done earlier. Please advise

## 2023-08-15 ENCOUNTER — Emergency Department (HOSPITAL_COMMUNITY)
Admission: EM | Admit: 2023-08-15 | Discharge: 2023-08-15 | Disposition: A | Discharge status: Home / Self Care | Payer: BC Managed Care – PPO | Attending: Emergency Medicine | Admitting: Emergency Medicine

## 2023-08-15 ENCOUNTER — Other Ambulatory Visit: Payer: Self-pay

## 2023-08-15 ENCOUNTER — Encounter (HOSPITAL_COMMUNITY): Payer: Self-pay

## 2023-08-15 ENCOUNTER — Emergency Department (HOSPITAL_COMMUNITY): Payer: BC Managed Care – PPO

## 2023-08-15 DIAGNOSIS — R1310 Dysphagia, unspecified: Secondary | ICD-10-CM | POA: Diagnosis present

## 2023-08-15 DIAGNOSIS — K2289 Other specified disease of esophagus: Secondary | ICD-10-CM | POA: Insufficient documentation

## 2023-08-15 LAB — BASIC METABOLIC PANEL
Anion gap: 12 (ref 5–15)
BUN: 7 mg/dL (ref 6–20)
CO2: 22 mmol/L (ref 22–32)
Calcium: 9.3 mg/dL (ref 8.9–10.3)
Chloride: 104 mmol/L (ref 98–111)
Creatinine, Ser: 1.18 mg/dL (ref 0.61–1.24)
GFR, Estimated: >60 mL/min (ref >60)
Glucose, Bld: 135 mg/dL — ABNORMAL HIGH (ref 70–99)
Potassium: 4.4 mmol/L (ref 3.5–5.1)
Sodium: 138 mmol/L (ref 135–145)

## 2023-08-15 LAB — CBC
HCT: 46 % (ref 39.0–52.0)
Hemoglobin: 16.2 g/dL (ref 13.0–17.0)
MCH: 29.9 pg (ref 26.0–34.0)
MCHC: 35.2 g/dL (ref 30.0–36.0)
MCV: 85 fL (ref 80.0–100.0)
Platelets: 257 10*3/uL (ref 150–400)
RBC: 5.41 MIL/uL (ref 4.22–5.81)
RDW: 12.7 % (ref 11.5–15.5)
WBC: 8.2 10*3/uL (ref 4.0–10.5)
nRBC: 0 % (ref 0.0–0.2)

## 2023-08-15 MED ORDER — DIAZEPAM 5 MG PO TABS: 5.0000 mg | ORAL_TABLET | Freq: Four times a day (QID) | ORAL | 0 refills | Status: DC | PRN

## 2023-08-15 MED ORDER — EPINEPHRINE 0.3 MG/0.3ML IJ SOAJ: 0.3000 mg | INTRAMUSCULAR | 0 refills | Status: AC | PRN

## 2023-08-15 NOTE — Discharge Instructions (Signed)
Please follow-up with your primary care provider in regards to his symptoms and ER visit.  Today your electrolytes, labs, chest x-ray are reassuring however it sounds as though you may have an esophageal pathology going on that is causing your symptoms.  Please have a liquid diet over the next 2 days and use Ensure or other knee replacement liquids to help give your esophagus time to recover.  Please call the cardiologist to see if you can schedule an echo to be seen sooner due to recent ER visit so that you may have your endoscopy sooner as this would be more beneficial.  I have prescribed for you EpiPen's to take if the prednisone does not work and Valium as well to help as a muscle relaxer.  Valium will make you drowsy so please do not operate machinery or drive after taking it.  Please talk to your primary care provider about being referred to the proper providers for mast cell activation workup if you are still concerned and want to be evaluated for that.  If symptoms change or worsen please return to ER.

## 2023-08-15 NOTE — ED Triage Notes (Signed)
Pt states been having reaction to benzapril and  itriconazole and they backed down on the dosage. Pt c/o throat swelling a day ago. Pt states hasn't been able to eat solid food in the last 8 days. Pt states a piece of egg got stuck in throat and took a benadryl to get it down. Pt ate a chip and he couldn't get it down. Pt states called dr and was put on prednisone. Pt states ate a cheeto and and couldn't swallow the chip and couldn;t breath and felt like he was about to "pass out." Pt able to maintain oral secretions.

## 2023-08-15 NOTE — ED Provider Notes (Signed)
[place] [facility] AT [facility] Provider Note   CSN: [id number] Arrival date & time: 08/15/23  3244     History  No chief complaint on file.   Aedon Percell is a 47 y.o. male history of food allergies, GERD presented with 3 days worth of throat swelling.  Patient states that he was diagnosed with mold by his holistic physician and was placed on itraconazole in which he is only able to take a few days worth as he states every time he tried to swallow the pill his throat closed up.  Patient notes over the past 36 hours he should have an egg weight, potato, cheeto and every time he eats those he feels the food gets stuck in his throat but eventually it will pass.  Last night after the patient had the cheeto incident he took some of the prednisone that was prescribed to him by his primary care provider yesterday and states this immediately relieved symptoms.  Patient is concerned he may have mast cell activation and was unable to see GI for his endoscopy until he gets an echo from cardiology however the echo is not scheduled for another few weeks and patient is concerned that he cannot last that much longer.  Patient is able to tolerate fluids.  Patient denies fevers, nausea/vomiting, chest pain, shortness of breath, abdominal pain.  Patient wants to be tested for nonselective vision along with having an echo so that he can get his endoscopy.   Home Medications Prior to Admission medications   Medication Sig Start Date End Date Taking? Authorizing Provider  diazepam (VALIUM) 5 MG tablet Take 1 tablet (5 mg total) by mouth every 6 (six) hours as needed for muscle spasms. 08/15/23  Yes Evlyn Kanner T, PA-C  EPINEPHrine 0.3 mg/0.3 mL IJ SOAJ injection Inject 0.3 mg into the muscle as needed for anaphylaxis. 08/15/23  Yes Phoebe Marter, Fayrene Fearing T, PA-C  albuterol (VENTOLIN HFA) 108 (90 Base) MCG/ACT inhaler Inhale 1 puff into the lungs as needed. 10/31/22   [provider]   amLODipine (NORVASC) 10 MG tablet Take 1 tablet (10 mg total) by mouth daily. 08/11/23 09/10/23  Tolia, Sunit, DO  itraconazole (SPORANOX) 100 MG capsule Take 100 mg by mouth 2 (two) times daily. 07/21/23   [provider]  ketoconazole (NIZORAL) 2 % cream Apply 1 Application topically 2 (two) times daily. 06/26/23   [provider]  Multiple Vitamin (MULTIVITAMIN) tablet Take 1 tablet by mouth daily.    [provider]  tadalafil (CIALIS) 5 MG tablet Take 5 mg by mouth as needed. 08/17/20   [provider]      Allergies    Penicillin g    Review of Systems   Review of Systems  Physical Exam Updated Vital Signs BP (!) 160/91   Pulse 80   Temp 98.2 F (36.8 C)   Resp 12   Ht 5\' 10"  (1.778 m)   Wt 119.3 kg   SpO2 99%   BMI 37.74 kg/m  Physical Exam Vitals reviewed.  Constitutional:      General: He is not in acute distress.    Comments: No acute distress drinking mineral water  HENT:     Head: Normocephalic and atraumatic.     Nose: Nose normal. No congestion.     Mouth/Throat:     Mouth: Mucous membranes are moist.     Pharynx: No oropharyngeal exudate or posterior oropharyngeal erythema.     Comments: No oropharynx swelling  or muffled voice Tolerance secretions No oral floor elevation Eyes:     Extraocular Movements: Extraocular movements intact.     Conjunctiva/sclera: Conjunctivae normal.     Pupils: Pupils are equal, round, and reactive to light.  Neck:     Comments: No neck swelling or facial swelling Cardiovascular:     Rate and Rhythm: Normal rate and regular rhythm.     Pulses: Normal pulses.     Heart sounds: Normal heart sounds.     Comments: 2+ bilateral radial/dorsalis pedis pulses with regular rate Pulmonary:     Effort: Pulmonary effort is normal. No respiratory distress.     Breath sounds: Normal breath sounds.  Abdominal:     Palpations: Abdomen is soft.     Tenderness: There is no abdominal tenderness. There  is no guarding or rebound.  Musculoskeletal:        General: Normal range of motion.     Cervical back: Normal range of motion and neck supple. No rigidity or tenderness.     Comments: 5 out of 5 bilateral grip/leg extension strength  Skin:    General: Skin is warm and dry.     Capillary Refill: Capillary refill takes less than 2 seconds.  Neurological:     Mental Status: He is alert and oriented to person, place, and time.     Comments: Sensation intact in all 4 limbs  Psychiatric:        Mood and Affect: Mood normal.     ED Results / Procedures / Treatments   Labs (all labs ordered are listed, but only abnormal results are displayed) Labs Reviewed  BASIC METABOLIC PANEL - Abnormal; Notable for the following components:      Result Value   Glucose, Bld 135 (*)    All other components within normal limits  CBC    EKG None  Radiology DG Chest Port 1 View  Result Date: 08/15/2023 CLINICAL DATA:  Aspiration. EXAM: PORTABLE CHEST 1 VIEW COMPARISON:  04/26/2016 FINDINGS: The heart size and mediastinal contours are within normal limits. Both lungs are clear. The visualized skeletal structures are unremarkable. IMPRESSION: No active disease. Electronically Signed   By: Danae Orleans M.D.   On: 08/15/2023 10:39    Procedures Procedures    Medications Ordered in ED Medications - No data to display  ED Course/ Medical Decision Making/ A&P                                 Medical Decision Making Amount and/or Complexity of Data Reviewed Labs: ordered. Radiology: ordered.   Debbrah Alar 47 y.o. presented today for dysphagia. Working DDx that I considered at this time includes, but not limited to, esophageal dysmotility, esophageal spasm, aspiration pneumonia, esophageal rupture, airway compromise, anaphylaxis, electrolyte abnormalities, dehydration.  R/o DDx: aspiration pneumonia, esophageal rupture, airway compromise, anaphylaxis, electrolyte abnormalities, dehydration:  These are considered less likely due to history of present illness, physical exam, labs/imaging findings  Review of prior external notes: 08/11/2023 office visit  Unique Tests and My Interpretation:  CBC: Unremarkable BMP: Unremarkable Chest x-ray: Unremarkable  Discussion with Independent Historian: None  Discussion of Management of Tests: None  Risk: Medium: prescription drug management  Risk Stratification Score: None  Staffed with Trifan, MD  Plan: On exam patient was no acute distress stable vitals.  Patient's exam was largely unremarkable and the patient I had an extensive conversation.  I spoke to the  patient about how we are unable to do the formal echo that he wants done as this will need to be done outpatient as there is no emergent reason for it at this time.  I spoke to the patient by how we do not have blood labs for mast cell activation that he will need to follow-up with his primary care provider to determine the proper referral as that is outside the scope of the ED at this time.  Patient's history does sound suspicious for possible esophageal spasm based on patient saying that when he eats certain foods his throat tightens closes times closes.  X-ray was obtained to rule out aspiration and this x-ray was negative.  Patient's labs are also reassuring at this time as well and patient not complain of chest pain or shortness of breath me and so troponins and EKG were not ordered.  Patient did have negative stress test done a few days ago cardiology as well as along with a reassuring EKG.  I do believe patient will need to see GI for his endoscopy as this will most likely help and we will offer the patient Valium as a muscle relaxer and patient has the symptoms to see if this helps but will ultimately discharge.  Will also prescribe EpiPen's at patient's request.  I offered Solu-Medrol or Decadron at this time however patient declined as he states he wants to continue taking the  prednisone as it is previously worked.  Workup was reassuring.  Patient is still tolerating fluids p.o. and is safe to be discharged.  I spoke to the patient about calling his cardiologist to see if he can schedule his echo sooner so they may have his endoscopy as I believe this would be beneficial for him.  I spoke to the attending and we agreed that patient will need to speak with his primary care provider in terms of workup for mast cell activation.  I spoke to the patient and he agreed to be prescribed EpiPen's along with Valium.  I spoke to the patient how value may make him drowsy and do not operate machinery or drive after taking it and he verbalized understanding.  Encourage patient to have a liquid diet over the next few days to give his esophagus a rest.  Encourage patient to continue using his prednisone.  Patient was given return precautions. Patient stable for discharge at this time.  Patient verbalized understanding of plan.  This chart was dictated using voice recognition software.  Despite best efforts to proofread,  errors can occur which can change the documentation meaning.         Final Clinical Impression(s) / ED Diagnoses Final diagnoses:  Esophageal pain    Rx / DC Orders ED Discharge Orders          Ordered    EPINEPHrine 0.3 mg/0.3 mL IJ SOAJ injection  As needed        08/15/23 1122    diazepam (VALIUM) 5 MG tablet  Every 6 hours PRN        08/15/23 1122              Netta Corrigan, PA-C 08/15/23 1132    Terald Sleeper, MD 08/15/23 1527

## 2023-08-17 NOTE — Telephone Encounter (Signed)
Pt stated that he had his Echo Scheduled for tomorrow. Pt was notified to call us back tomorrow afternoon and that we will get the pt scheduled for the EGD as soon as the results are posted.  Pt verbalized understanding with all questions answered.

## 2023-08-17 NOTE — Telephone Encounter (Signed)
Inbound call from patient in regards to previous note. Please advise.

## 2023-08-18 ENCOUNTER — Ambulatory Visit (HOSPITAL_COMMUNITY): Payer: BC Managed Care – PPO | Attending: Internal Medicine

## 2023-08-18 ENCOUNTER — Other Ambulatory Visit: Payer: Self-pay

## 2023-08-18 DIAGNOSIS — R0602 Shortness of breath: Secondary | ICD-10-CM

## 2023-08-18 DIAGNOSIS — I10 Essential (primary) hypertension: Secondary | ICD-10-CM | POA: Diagnosis present

## 2023-08-18 DIAGNOSIS — Z01810 Encounter for preprocedural cardiovascular examination: Secondary | ICD-10-CM

## 2023-08-18 DIAGNOSIS — R7303 Prediabetes: Secondary | ICD-10-CM

## 2023-08-18 DIAGNOSIS — E782 Mixed hyperlipidemia: Secondary | ICD-10-CM

## 2023-08-18 DIAGNOSIS — R1319 Other dysphagia: Secondary | ICD-10-CM

## 2023-08-18 DIAGNOSIS — R0789 Other chest pain: Secondary | ICD-10-CM

## 2023-08-18 LAB — ECHOCARDIOGRAM COMPLETE
Area-P 1/2: 3.42 cm2
S' Lateral: 3.2 cm

## 2023-08-18 NOTE — Telephone Encounter (Signed)
Error

## 2023-08-18 NOTE — Telephone Encounter (Signed)
Pt stated that he has completed his recent test. Dr. Leone Payor made aware.  Pt was scheduled for an EGD on 08/20/2023 at 3:30 PM with Dr. Leone Payor in the [facility]. Pt made aware.  Ambulatory referral to GI placed in EPIC.  Prep instructions were sent to pt via my chart. Pt made aware. Pt stated that he has been having difficulty swallowing. Pt was notified to stay on liquids until his procedure.   Pt verbalized understanding with all questions answered.

## 2023-08-19 ENCOUNTER — Telehealth: Payer: Self-pay | Admitting: Cardiology

## 2023-08-19 ENCOUNTER — Encounter: Payer: Self-pay | Admitting: Cardiology

## 2023-08-19 ENCOUNTER — Encounter: Payer: Self-pay | Admitting: Certified Registered Nurse Anesthetist

## 2023-08-19 ENCOUNTER — Encounter: Payer: Self-pay | Admitting: Internal Medicine

## 2023-08-19 NOTE — Telephone Encounter (Signed)
PT is calling to find out if he can take valium prior to procedure. Please advise.

## 2023-08-19 NOTE — Telephone Encounter (Signed)
Pt is calling to f/u on getting a callback regarding him having questions about his Echo results. He'd like to speak with a nurse today since he has Endoscopy being done tomorrow. [phone] is the number he'd like to be called on. Please advise.

## 2023-08-19 NOTE — Telephone Encounter (Signed)
Patient is requesting a call back to discuss echo results. 

## 2023-08-20 ENCOUNTER — Encounter: Payer: Self-pay | Admitting: Internal Medicine

## 2023-08-20 ENCOUNTER — Ambulatory Visit: Payer: BC Managed Care – PPO | Admitting: Internal Medicine

## 2023-08-20 VITALS — BP 121/94 | HR 63 | Temp 97.8°F | Resp 10 | Ht 70.0 in | Wt 274.0 lb

## 2023-08-20 DIAGNOSIS — K2100 Gastro-esophageal reflux disease with esophagitis, without bleeding: Secondary | ICD-10-CM | POA: Diagnosis not present

## 2023-08-20 DIAGNOSIS — K222 Esophageal obstruction: Secondary | ICD-10-CM | POA: Diagnosis present

## 2023-08-20 DIAGNOSIS — R1319 Other dysphagia: Secondary | ICD-10-CM

## 2023-08-20 MED ORDER — OMEPRAZOLE 40 MG PO CPDR: 40.0000 mg | DELAYED_RELEASE_CAPSULE | Freq: Every day | ORAL | 3 refills | Status: DC

## 2023-08-20 MED ORDER — SODIUM CHLORIDE 0.9 % IV SOLN: 500.0000 mL | INTRAVENOUS | Status: AC

## 2023-08-20 NOTE — Telephone Encounter (Signed)
Left message for patient to call back  

## 2023-08-20 NOTE — Telephone Encounter (Signed)
See the other telephone encounter.   Arwen Haseley [place], DO, [facility]

## 2023-08-20 NOTE — Op Note (Signed)
[facility] Patient Name: Dennis Aguilar Procedure Date: 08/20/2023 3:44 PM MRN: [id number] Endoscopist: Iva Boop , MD, [id number] Age: 47 Referring MD:  Date of Birth: Dec 05, 1975 Gender: Male Account #: [id number] Procedure:                Upper GI endoscopy Indications:              Dysphagia Medicines:                Monitored Anesthesia Care Procedure:                Pre-Anesthesia Assessment:                           - Prior to the procedure, a History and Physical                            was performed, and patient medications and                            allergies were reviewed. The patient's tolerance of                            previous anesthesia was also reviewed. The risks                            and benefits of the procedure and the sedation                            options and risks were discussed with the patient.                            All questions were answered, and informed consent                            was obtained. Prior Anticoagulants: The patient has                            taken no anticoagulant or antiplatelet agents. ASA                            Grade Assessment: II - A patient with mild systemic                            disease. After reviewing the risks and benefits,                            the patient was deemed in satisfactory condition to                            undergo the procedure.                           After obtaining informed consent, the endoscope was  passed under direct vision. Throughout the                            procedure, the patient's blood pressure, pulse, and                            oxygen saturations were monitored continuously. The                            GIF [id number] #[id number] was introduced through the                            mouth, and advanced to the second part of duodenum.                            The upper GI endoscopy was accomplished  without                            difficulty. The patient tolerated the procedure                            well. Scope In: Scope Out: Findings:                 A non-obstructing Schatzki ring was found at the                            gastroesophageal junction.                           LA Grade A (one or more mucosal breaks less than 5                            mm, not extending between tops of 2 mucosal folds)                            esophagitis with no bleeding was found at the                            gastroesophageal junction. Biopsies were taken with                            a cold forceps for histology. Verification of                            patient identification for the specimen was done.                            Estimated blood loss was minimal.                           Islands of salmon-colored mucosa were present. No                            other visible  abnormalities were present. Biopsies                            were taken with a cold forceps for histology.                            Verification of patient identification for the                            specimen was done. Estimated blood loss was minimal.                           Diffuse mildly erythematous mucosa without bleeding                            was found in the gastric antrum. Biopsies were                            taken with a cold forceps for histology.                            Verification of patient identification for the                            specimen was done. Estimated blood loss was minimal.                           The exam was otherwise without abnormality.                           The cardia and gastric fundus were normal on                            retroflexion.                           Biopsies were taken with a cold forceps in the                            proximal esophagus for histology. Verification of                            patient identification  for the specimen was done.                            Estimated blood loss was minimal.                           A TTS dilator was passed through the scope.                            Dilation with an 18-19-20 mm balloon dilator was  performed to 20 mm in the entire esophagus.                            Estimated blood loss: none. Complications:            No immediate complications. Estimated Blood Loss:     Estimated blood loss was minimal. Impression:               - Non-obstructing Schatzki ring. No change after 20                            mm balloon dilation                           - LA Grade A reflux esophagitis with no bleeding.                            Biopsied. Irregular edematous mucosa at GE junction                            with slight erosive changes                           - Salmon-colored mucosa in very proximal esophagus,                            2 islands - look like inlet patches. Biopsied.                           - Erythematous mucosa in the antrum. Biopsied.                           - The examination was otherwise normal.                           - Biopsies were taken with a cold forceps for                            histology in the proximal esophagus. ? EOE/other -                            no signs of EOE obvious                           - Dilation performed in the entire esophagus.                            dilated GE junction area at 20 mm - no dilation                            effect seen, then retrograde pull through esophagus                            and UES at 18 mm w/o dilation effect.                           -  I did not find abnormalities that would explain                            his extreme dysphagia and neither were they seen on                            prior ba swallow. ? contribution of ongoing anxiety. Recommendation:           - Patient has a contact number available for                             emergencies. The signs and symptoms of potential                            delayed complications were discussed with the                            patient. Return to normal activities tomorrow.                            Written discharge instructions were provided to the                            patient.                           - Clear liquids x 1 hour then soft foods rest of                            day. Start prior diet tomorrow as tolerated (solids                            hopefully).                           - Continue present medications.                           - Await pathology results.                           - Stay off itraconazole, start omeprazole 40 mg qd                            (open capsule if necessary)                           - F/U PCP anxiety Iva Boop, MD 08/20/2023 4:17:50 PM This report has been signed electronically.

## 2023-08-20 NOTE — Telephone Encounter (Signed)
Pt returned call & stated he took 2.5 mg of Valium early this morning for anxiety & throat spasms. He is scheduled today at 3:30 for EGD in the [facility] with Dr. Leone Payor & would like to know if he could take another dose. Advised him I would reach out to MD for recommendations. Secure chat sent to MD.

## 2023-08-20 NOTE — Progress Notes (Signed)
Report given to PACU, vss 

## 2023-08-20 NOTE — Progress Notes (Signed)
1554  Pt experienced laryngeal spasm with jaw thrust performed. vss

## 2023-08-20 NOTE — Telephone Encounter (Signed)
Received secure chat from Dr. Leone Payor with the following recommendations: "It is ok to take it with small amount of water but latest he can take it is 1 PM". Pt has been made aware & verbalized all understanding.

## 2023-08-20 NOTE — Progress Notes (Signed)
Called to room to assist during endoscopic procedure.  Patient ID and intended procedure confirmed with present staff. Received instructions for my participation in the procedure from the performing physician.  

## 2023-08-20 NOTE — Progress Notes (Signed)
1550 HR > 100 with esmolol 25 mg given IV, MD updated, vss  ?

## 2023-08-20 NOTE — Progress Notes (Signed)
[facility] History and Physical   Primary Care Physician:  Andi Devon, MD   Reason for Procedure:   dysphagia  Plan:    EGD, possible esophageal dilation     HPI: Dennis Aguilar is a 47 y.o. male with c/o severe dysphagia and only able to swallow liquids and some pills. Ba swallow unrevealing. Due to recent chest pain saw cardiology and Stress test and Echo ok. I have communicated with cardiology and cleared to sedate/proceed.     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   + H2 Lactulose breath test 01/25/2018    Past Surgical History:  Procedure Laterality Date   COLONOSCOPY WITH ESOPHAGOGASTRODUODENOSCOPY (EGD)  2019   HAND SURGERY Right    KNEE ARTHROSCOPY WITH LATERAL MENISECTOMY Left     Prior to Admission medications   Medication Sig Start Date End Date Taking? Authorizing Provider  diazepam (VALIUM) 5 MG tablet Take 1 tablet (5 mg total) by mouth every 6 (six) hours as needed for muscle spasms. 08/15/23  Yes Schuman, Fayrene Fearing T, PA-C  albuterol (VENTOLIN HFA) 108 (90 Base) MCG/ACT inhaler Inhale 1 puff into the lungs as needed. 10/31/22   [provider]  amLODipine (NORVASC) 10 MG tablet Take 1 tablet (10 mg total) by mouth daily. 08/11/23 09/10/23  Tolia, Sunit, DO  EPINEPHrine 0.3 mg/0.3 mL IJ SOAJ injection Inject 0.3 mg into the muscle as needed for anaphylaxis. 08/15/23   Netta Corrigan, PA-C  itraconazole (SPORANOX) 100 MG capsule Take 100 mg by mouth 2 (two) times daily. 07/21/23   [provider]  ketoconazole (NIZORAL) 2 % cream Apply 1 Application topically 2 (two) times daily. 06/26/23   [provider]  Multiple Vitamin (MULTIVITAMIN) tablet Take 1 tablet by mouth daily.    [provider]  tadalafil (CIALIS) 5 MG tablet Take 5 mg by mouth as needed. 08/17/20   [provider]    Current Outpatient Medications  Medication Sig Dispense Refill   diazepam (VALIUM) 5 MG tablet Take 1 tablet (5 mg total) by mouth every 6 (six) hours as needed for muscle spasms. 10 tablet 0   albuterol (VENTOLIN HFA) 108 (90 Base) MCG/ACT inhaler Inhale 1 puff into the lungs as needed.     amLODipine (NORVASC) 10 MG tablet Take 1 tablet (10 mg total) by mouth daily. 30 tablet 0   EPINEPHrine 0.3 mg/0.3 mL IJ SOAJ injection Inject 0.3 mg into the muscle as needed for anaphylaxis. 1 each 0   itraconazole (SPORANOX) 100 MG capsule Take 100 mg by mouth 2 (two) times daily.     ketoconazole (NIZORAL) 2 % cream Apply 1 Application topically 2 (two) times daily.     Multiple Vitamin (MULTIVITAMIN) tablet Take 1 tablet by mouth daily.     tadalafil (CIALIS) 5 MG tablet Take 5 mg by mouth as needed.     Current Facility-Administered Medications  Medication Dose Route Frequency Provider Last Rate Last Admin   0.9 %  sodium chloride infusion  500 mL Intravenous Continuous Iva Boop, MD        Allergies as of 08/20/2023 - Review Complete 08/20/2023  Allergen Reaction Noted   Penicillin g Hives 07/15/2023    Family History  Problem Relation Age of Onset   Urticaria Mother    Heart disease Father    Allergic rhinitis Father    Urticaria Sister    Heart attack Maternal Grandfather    Heart disease Paternal Grandfather    Dementia Paternal Grandfather    Prostate cancer Paternal Grandfather    Esophageal cancer Neg Hx    Stomach cancer Neg Hx    Liver disease Neg Hx    Asthma Neg Hx    Eczema Neg Hx     Social History   Socioeconomic History   Marital status: Married    Spouse name: Not on file   Number of children: 6   Years of education: Not on file   Highest education level: Not on file  Occupational History   Occupation: Garment/textile technologist  Tobacco Use    Smoking status: Never   Smokeless tobacco: Former    Types: Chew    Quit date: 01/19/2003  Vaping Use   Vaping status: Unknown  Substance and Sexual Activity   Alcohol use: Yes    Comment: occasinal   Drug use: Never   Sexual activity: Not on file  Other Topics Concern   Not on file  Social History Narrative   The patient is married with 6 sons as of 2019 they range in age from 1-13   He is a Garment/textile technologist   2 caffeinated beverages daily   No smoking tobacco or drug use or alcohol   Social Determinants of Health   Financial Resource Strain: Not on file  Food Insecurity: Low Risk  (07/15/2023)   Received from [facility]   Hunger Vital Sign    Worried About Running Out of Food in the Last Year: Never true    Ran Out of Food in the Last Year: Never true  Transportation Needs: No Transportation Needs (07/15/2023)   Received from [facility]    In the past 12 months, has lack of reliable transportation kept you from medical appointments, meetings, work or from getting things needed for daily living? : No  Physical Activity: Not on file  Stress: Not on file  Social Connections: Unknown (03/09/2022)   Received from [facility]   Social Network    Social Network: Not on file  Intimate Partner Violence: Unknown (01/29/2022)   Received from [facility], [facility]   HITS    Physically Hurt: Not on file    Insult or Talk Down To: Not on file    Threaten Physical Harm: Not on file    Scream or Curse: Not on file    Review of Systems:  All other review of systems negative except as mentioned in the HPI.  Physical Exam: Vital signs BP (!) 150/87   Pulse 88   Temp 97.8 F (36.6 C)   Ht 5\' 10"  (1.778 m)   Wt 274 lb (124.3 kg)   SpO2 98%   BMI 39.31 kg/m   General:   Alert,  Well-developed, well-nourished, pleasant and cooperative in NAD Lungs:  Clear throughout to auscultation.   Heart:  Regular rate and rhythm; no murmurs, clicks,  rubs,  or gallops. Abdomen:  Soft, nontender and nondistended. Normal bowel sounds.   Neuro/Psych:  Alert and cooperative. Normal mood and affect. A and O x 3   @Rene Gonsoulin  Sena Slate, MD, [facility] [phone] (pager) 08/20/2023 3:35 PM@

## 2023-08-20 NOTE — Patient Instructions (Addendum)
The esophagus appears open though I did dilate it to see if that helps.  I took stomach and esophageal biopsies as outlined in the report. I saw changes of acid reflux and have prescribed omeprazole. You may open the capsule and swallow the granules with water if need be.  Sometimes there is soreness in the esophageal or chest area related to biopsies, FYI.  I think underlying anxiety may be contributing to your problems. Please address with primary care if this persists.  I will let you know pathology results and recommendations after that.  Stay off the itraconazole at this time.  I appreciate the opportunity to care for you. Iva Boop, MD, [facility]  Follow Dilation Diet: CLEAR LIQUIDS FOR 1 HOUR AND THEN SOFT DIET FOR NEXT 24 HOURS.   YOU HAD AN ENDOSCOPIC PROCEDURE TODAY AT THE [facility]:   Refer to the procedure report that was given to you for any specific questions about what was found during the examination.  If the procedure report does not answer your questions, please call your gastroenterologist to clarify.  If you requested that your care partner not be given the details of your procedure findings, then the procedure report has been included in a sealed envelope for you to review at your convenience later.  YOU SHOULD EXPECT: Some feelings of bloating in the abdomen. Passage of more gas than usual.  Walking can help get rid of the air that was put into your GI tract during the procedure and reduce the bloating. If you had a lower endoscopy (such as a colonoscopy or flexible sigmoidoscopy) you may notice spotting of blood in your stool or on the toilet paper. If you underwent a bowel prep for your procedure, you may not have a normal bowel movement for a few days.  Please Note:  You might notice some irritation and congestion in your nose or some drainage.  This is from the oxygen used during your procedure.  There is no need for concern and it should clear up in a  day or so.  SYMPTOMS TO REPORT IMMEDIATELY:  Following upper endoscopy (EGD)  Vomiting of blood or coffee ground material  New chest pain or pain under the shoulder blades  Painful or persistently difficult swallowing  New shortness of breath  Fever of 100F or higher  Black, tarry-looking stools  For urgent or emergent issues, a gastroenterologist can be reached at any hour by calling [phone]. Do not use MyChart messaging for urgent concerns.    DIET:  We do recommend a small meal at first, but then you may proceed to your regular diet.  Drink plenty of fluids but you should avoid alcoholic beverages for 24 hours.  ACTIVITY:  You should plan to take it easy for the rest of today and you should NOT DRIVE or use heavy machinery until tomorrow (because of the sedation medicines used during the test).    FOLLOW UP: Our staff will call the number listed on your records the next business day following your procedure.  We will call around 7:15- 8:00 am to check on you and address any questions or concerns that you may have regarding the information given to you following your procedure. If we do not reach you, we will leave a message.     If any biopsies were taken you will be contacted by phone or by letter within the next 1-3 weeks.  Please call us at [phone] if you have not  heard about the biopsies in 3 weeks.    SIGNATURES/CONFIDENTIALITY: You and/or your care partner have signed paperwork which will be entered into your electronic medical record.  These signatures attest to the fact that that the information above on your After Visit Summary has been reviewed and is understood.  Full responsibility of the confidentiality of this discharge information lies with you and/or your care-partner.

## 2023-08-20 NOTE — Telephone Encounter (Signed)
Spoke to him over the phone.  His questions answered.  LVEF is normal.  RV size is dilated but function is preserved. No findings to suggest pulmonary hypertension at this time.  Educated him on the importance of losing weight and getting tested for sleep apnea. If shortness of breath continues additional testing can be considered.  Endoscopy went well.   Dennis Schnieders Flagler, DO, [facility]

## 2023-08-20 NOTE — Progress Notes (Signed)
1545 Robinul 0.1 mg IV given due large amount of secretions upon assessment.  MD made aware, vss  

## 2023-08-21 ENCOUNTER — Telehealth: Payer: Self-pay | Admitting: *Deleted

## 2023-08-21 ENCOUNTER — Telehealth: Payer: Self-pay | Admitting: Internal Medicine

## 2023-08-21 NOTE — Telephone Encounter (Signed)
  Follow up Call-     08/20/2023    2:41 PM  Call back number  Post procedure Call Back phone  # [phone]  Permission to leave phone message Yes     Patient questions:  Do you have a fever, pain , or abdominal swelling? No. Pain Score  0 *  Have you tolerated food without any problems? Yes.    Have you been able to return to your normal activities? Yes.    Do you have any questions about your discharge instructions: Diet   No. Medications  No. Follow up visit  No.  Do you have questions or concerns about your Care? No.  Actions: * If pain score is 4 or above: No action needed, pain <4.

## 2023-08-21 NOTE — Telephone Encounter (Signed)
PT had EGD yesterday and is experiencing shortness of breath and middle of chest hurting. Please advise.

## 2023-08-21 NOTE — Telephone Encounter (Signed)
Returned call to patient. He reports shortness of breath with talking. He feels "something" in middle of his chest that does not go away after he burps. Advised patient to seek medical care at urgent care or an emergency department based on patient report. Patient verbalized understanding .

## 2023-08-21 NOTE — Telephone Encounter (Signed)
Agree with RN recommendations.

## 2023-08-27 LAB — SURGICAL PATHOLOGY

## 2023-08-28 ENCOUNTER — Encounter: Payer: Self-pay | Admitting: Internal Medicine

## 2023-08-31 ENCOUNTER — Telehealth: Payer: Self-pay | Admitting: Internal Medicine

## 2023-08-31 NOTE — Telephone Encounter (Signed)
Inbound call from patient returning phone call. Please advise, thank you.  

## 2023-08-31 NOTE — Telephone Encounter (Signed)
Left message for pt to call back  °

## 2023-08-31 NOTE — Telephone Encounter (Signed)
Inbound call from patient, states he is still having throat spasms, he states he is still  having difficulty swallowing. States he is still on liquids and apple sauce. He would like to know if Dr. Leone Payor believes he should have another stretching or what he would recommend. Patient is scheduled for 12/20. But is urgently requesting a sooner appointment. Please advise.

## 2023-09-01 ENCOUNTER — Other Ambulatory Visit: Payer: Self-pay

## 2023-09-01 DIAGNOSIS — R1319 Other dysphagia: Secondary | ICD-10-CM

## 2023-09-01 NOTE — Telephone Encounter (Signed)
Pt made aware of recent results and Dr. Leone Payor recommendations: Orders for modified barium swallow entered. Message sent to Lesly Rubenstein and April Pait to schedule. Pt made aware our scheduling team will contact hime Pt verbalized understanding with all questions answered.

## 2023-09-01 NOTE — Telephone Encounter (Signed)
The esophagus is ok as best I can tell.  Let's have him do a modified barium swallow to evaluate his dysphagia further.

## 2023-09-01 NOTE — Telephone Encounter (Signed)
Pt stated that he is still having esophageal spasms and still taking valium's, eating apple sauce and yogurt.  Pt wanted to question Dr. Leone Payor if he still think's that his throat size is still small to the average male. Pt stated that he was stretched to an 18.  Pt also questioned if he needed follow up EGD with Dilations: Please review and advise

## 2023-09-02 ENCOUNTER — Other Ambulatory Visit (HOSPITAL_COMMUNITY): Payer: Self-pay | Admitting: Internal Medicine

## 2023-09-02 DIAGNOSIS — R1310 Dysphagia, unspecified: Secondary | ICD-10-CM

## 2023-09-05 ENCOUNTER — Other Ambulatory Visit: Payer: Self-pay | Admitting: Cardiology

## 2023-09-05 DIAGNOSIS — I10 Essential (primary) hypertension: Secondary | ICD-10-CM

## 2023-09-09 ENCOUNTER — Other Ambulatory Visit (HOSPITAL_COMMUNITY): Payer: BC Managed Care – PPO

## 2023-09-10 NOTE — Telephone Encounter (Signed)
Pt is scheduled for MBSS tomorrow 09/11/23.

## 2023-09-11 ENCOUNTER — Ambulatory Visit (HOSPITAL_COMMUNITY)
Admission: RE | Admit: 2023-09-11 | Discharge: 2023-09-11 | Disposition: A | Discharge status: Home / Self Care | Payer: BC Managed Care – PPO | Source: Ambulatory Visit | Attending: Internal Medicine | Admitting: Internal Medicine

## 2023-09-11 ENCOUNTER — Ambulatory Visit (HOSPITAL_COMMUNITY)
Admission: RE | Admit: 2023-09-11 | Discharge: 2023-09-11 | Disposition: A | Discharge status: Home / Self Care | Payer: BC Managed Care – PPO | Source: Ambulatory Visit | Attending: *Deleted | Admitting: *Deleted

## 2023-09-11 DIAGNOSIS — R1319 Other dysphagia: Secondary | ICD-10-CM | POA: Diagnosis present

## 2023-09-11 DIAGNOSIS — R1310 Dysphagia, unspecified: Secondary | ICD-10-CM

## 2023-09-11 NOTE — Evaluation (Signed)
Modified Barium Swallow Study  Patient Details  Name: Dennis Aguilar MRN: [id number] Date of Birth: 12-12-1975  Today's Date: 09/11/2023  Modified Barium Swallow completed.  Full report located under Chart Review in the Imaging Section.  History of Present Illness Pt is a 47 yo male referred for OP MBS by GI, who is following him for dysphagia to solids and pills. He has had recent esophagram that was WNL (barium tablet not given), followed by EGD with dilation. Pt reports minimal relief of symptoms and that he is primarily consuming liquids and purees. He has not been taking all of his pills unless he has been able to crush them up. PMH includes: GERD, anxiety, recurrent URI, h/o food allergy/GI symptoms   Clinical Impression Pt's oropharyngeal swallow is WFL. He has no aspiration and no pharyngeal residue. Note that pt had subjective reports of boluses feeling stuck during the study, but there was no barium retention to correlate with this. Suspect globus sensation. Pt did not feel comfortable taking the barium tablet despite encouragement. He has not been able to take his reflux medication because it is too large to swallow - would consider an alternative that might be more manageable. SLP also provided him with education including handout regarding esophageal precautions. Pt is concerned about his esophagus needing to be stretched further. Would recommend ongoing f/u wtih GI to try to help with any underlying medical issues. After that, if symptoms persist, could consider OP SLP f/u in addition to try to facilitate return to more solid POs.  Factors that may increase risk of adverse event in presence of aspiration Rubye Oaks & Clearance Coots 2021): Respiratory or GI disease  Swallow Evaluation Recommendations Recommendations: PO diet PO Diet Recommendation: Regular;Thin liquids (Level 0) (as tolerated) Liquid Administration via: Cup;Straw Medication Administration:  (as tolerated - would consider  smaller pills or liquid formularies as able, at least until pt feels like he is able to swallow again) Supervision: Patient able to self-feed Swallowing strategies  : Slow rate;Small bites/sips;Follow solids with liquids Postural changes: Position pt fully upright for meals;Stay upright 30-60 min after meals Oral care recommendations: Oral care BID (2x/day)      Mahala Menghini., M.A. CCC-SLP Acute Rehabilitation Services Office [phone]  Secure chat preferred  09/11/2023,3:07 PM

## 2023-09-21 ENCOUNTER — Ambulatory Visit: Payer: BC Managed Care – PPO | Admitting: Cardiology

## 2023-10-16 ENCOUNTER — Ambulatory Visit: Payer: BC Managed Care – PPO | Admitting: Internal Medicine

## 2023-10-16 DIAGNOSIS — R1319 Other dysphagia: Secondary | ICD-10-CM

## 2023-10-16 DIAGNOSIS — R1310 Dysphagia, unspecified: Secondary | ICD-10-CM

## 2023-10-16 DIAGNOSIS — K2100 Gastro-esophageal reflux disease with esophagitis, without bleeding: Secondary | ICD-10-CM

## 2023-10-16 DIAGNOSIS — Z860100 Personal history of colon polyps, unspecified: Secondary | ICD-10-CM

## 2023-10-16 DIAGNOSIS — Z860101 Personal history of adenomatous and serrated colon polyps: Secondary | ICD-10-CM

## 2023-10-16 NOTE — Patient Instructions (Signed)
Try opening the omeprazole capsules and putting them in applesauce or liquid and swallowing them that way.  Keep monitoring your swallowing if it fails to improve all the way let me know.  We have decided to postpone colonoscopy until next year.  I have placed a reminder in our system for Spring 2025.  We usually notified by letters but please put that in your calendar as well.  Iva Boop, MD, [facility]

## 2023-10-16 NOTE — Progress Notes (Addendum)
Telephone visit  Patient at home, I was in my office.  Dennis Aguilar 47 y.o. [birth year] [id number]  Assessment & Plan:   Encounter Diagnoses  Name Primary?   Esophageal dysphagia Yes   Gastroesophageal reflux disease with esophagitis without hemorrhage    Hx of colonic polyps - sessile serrated    There is some improvement.  I offered esophageal manometry he wants to continue trying to advance his diet over time.  I think it is fine that he opens the omeprazole and puts it into applesauce.  He does not want to schedule a colonoscopy at this time.  I will put a recall in for April and we can schedule after that.  Only 2 small SSP's. Subjective:   Chief Complaint: Dysphagia  HPI The patient reports his swallowing is better though not normal he has not tested solid food he eats soft foods all the time he lost about 35 pounds total with his dysphagia issues, but is starting to gain back.  He is asking if there is a smaller pill for reflux other than the omeprazole.  He says the pharmacy told him he could not open that up.  He had a fever to 102 today so we changed this to a phone visit at his request.  He denies globus.  He says he gets intermittent spasms in the esophagus and he has been given Valium  prescription from an ER visit and that is helpful at times.  Previous workup: Allergies  Allergen Reactions   Penicillin G Hives    Current Outpatient Medications:    albuterol (VENTOLIN HFA) 108 (90 Base) MCG/ACT inhaler, Inhale 1 puff into the lungs as needed., Disp: , Rfl:    amLODipine (NORVASC) 10 MG tablet, Take 1 tablet (10 mg total) by mouth daily., Disp: 90 tablet, Rfl: 3   diazepam (VALIUM) 5 MG tablet, Take 1 tablet (5 mg total) by mouth every 6 (six) hours as needed for muscle spasms., Disp: 10 tablet, Rfl: 0   EPINEPHrine 0.3 mg/0.3 mL IJ SOAJ injection, Inject 0.3 mg into the muscle as needed for anaphylaxis., Disp: 1 each, Rfl: 0   ketoconazole (NIZORAL) 2 % cream,  Apply 1 Application topically 2 (two) times daily., Disp: , Rfl:    Multiple Vitamin (MULTIVITAMIN) tablet, Take 1 tablet by mouth daily., Disp: , Rfl:    omeprazole (PRILOSEC) 40 MG capsule, Take 1 capsule (40 mg total) by mouth daily before breakfast., Disp: 90 capsule, Rfl: 3   tadalafil (CIALIS) 5 MG tablet, Take 5 mg by mouth as needed., Disp: , Rfl:   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   + H2 Lactulose breath test 01/25/2018   Past Surgical History:  Procedure Laterality Date   COLONOSCOPY WITH ESOPHAGOGASTRODUODENOSCOPY (EGD)  2019   HAND SURGERY Right    KNEE ARTHROSCOPY WITH LATERAL MENISECTOMY Left    Social History   Social History Narrative   The patient is married with 6 sons as of 2019 they range in age from 1-13   He is a Garment/textile technologist   2 caffeinated beverages daily   No smoking tobacco or drug use or alcohol   family history includes Allergic rhinitis in his father; Dementia in  his paternal grandfather; Heart attack in his maternal grandfather; Heart disease in his father and paternal grandfather; Prostate cancer in his paternal grandfather; Urticaria in his mother and sister.   Review of Systems As above  Objective:   Physical Exam Not performed  13 minute phone discussion

## 2024-01-20 ENCOUNTER — Encounter (HOSPITAL_BASED_OUTPATIENT_CLINIC_OR_DEPARTMENT_OTHER): Payer: Self-pay

## 2024-01-20 ENCOUNTER — Emergency Department (HOSPITAL_BASED_OUTPATIENT_CLINIC_OR_DEPARTMENT_OTHER)
Admission: EM | Admit: 2024-01-20 | Discharge: 2024-01-20 | Disposition: A | Discharge status: Home / Self Care | Attending: Emergency Medicine | Admitting: Emergency Medicine

## 2024-01-20 ENCOUNTER — Other Ambulatory Visit: Payer: Self-pay

## 2024-01-20 DIAGNOSIS — I10 Essential (primary) hypertension: Secondary | ICD-10-CM | POA: Insufficient documentation

## 2024-01-20 DIAGNOSIS — K0889 Other specified disorders of teeth and supporting structures: Secondary | ICD-10-CM | POA: Insufficient documentation

## 2024-01-20 DIAGNOSIS — Z79899 Other long term (current) drug therapy: Secondary | ICD-10-CM | POA: Insufficient documentation

## 2024-01-20 DIAGNOSIS — J45909 Unspecified asthma, uncomplicated: Secondary | ICD-10-CM | POA: Diagnosis not present

## 2024-01-20 DIAGNOSIS — R42 Dizziness and giddiness: Secondary | ICD-10-CM | POA: Insufficient documentation

## 2024-01-20 LAB — COMPREHENSIVE METABOLIC PANEL
ALT: 49 U/L — ABNORMAL HIGH (ref 0–44)
AST: 27 U/L (ref 15–41)
Albumin: 4.7 g/dL (ref 3.5–5.0)
Alkaline Phosphatase: 53 U/L (ref 38–126)
Anion gap: 9 (ref 5–15)
BUN: 11 mg/dL (ref 6–20)
CO2: 26 mmol/L (ref 22–32)
Calcium: 9.4 mg/dL (ref 8.9–10.3)
Chloride: 101 mmol/L (ref 98–111)
Creatinine, Ser: 0.89 mg/dL (ref 0.61–1.24)
GFR, Estimated: >60 mL/min (ref >60)
Glucose, Bld: 161 mg/dL — ABNORMAL HIGH (ref 70–99)
Potassium: 4.7 mmol/L (ref 3.5–5.1)
Sodium: 136 mmol/L (ref 135–145)
Total Bilirubin: 0.9 mg/dL (ref 0.0–1.2)
Total Protein: 7.3 g/dL (ref 6.5–8.1)

## 2024-01-20 LAB — CBG MONITORING, ED: Glucose-Capillary: 170 mg/dL — ABNORMAL HIGH (ref 70–99)

## 2024-01-20 LAB — CBC WITH DIFFERENTIAL/PLATELET
Abs Immature Granulocytes: 0.02 10*3/uL (ref 0.00–0.07)
Basophils Absolute: 0 10*3/uL (ref 0.0–0.1)
Basophils Relative: 1 %
Eosinophils Absolute: 0.1 10*3/uL (ref 0.0–0.5)
Eosinophils Relative: 1 %
HCT: 44.9 % (ref 39.0–52.0)
Hemoglobin: 16.1 g/dL (ref 13.0–17.0)
Immature Granulocytes: 0 %
Lymphocytes Relative: 27 %
Lymphs Abs: 1.4 10*3/uL (ref 0.7–4.0)
MCH: 31.5 pg (ref 26.0–34.0)
MCHC: 35.9 g/dL (ref 30.0–36.0)
MCV: 87.9 fL (ref 80.0–100.0)
Monocytes Absolute: 0.7 10*3/uL (ref 0.1–1.0)
Monocytes Relative: 12 %
Neutro Abs: 3.1 10*3/uL (ref 1.7–7.7)
Neutrophils Relative %: 59 %
Platelets: 232 10*3/uL (ref 150–400)
RBC: 5.11 MIL/uL (ref 4.22–5.81)
RDW: 13 % (ref 11.5–15.5)
WBC: 5.3 10*3/uL (ref 4.0–10.5)
nRBC: 0 % (ref 0.0–0.2)

## 2024-01-20 LAB — URINALYSIS, W/ REFLEX TO CULTURE (INFECTION SUSPECTED)
Bacteria, UA: NONE SEEN
Bilirubin Urine: NEGATIVE
Glucose, UA: NEGATIVE mg/dL
Hgb urine dipstick: NEGATIVE
Ketones, ur: NEGATIVE mg/dL
Leukocytes,Ua: NEGATIVE
Nitrite: NEGATIVE
Protein, ur: NEGATIVE mg/dL
Specific Gravity, Urine: 1.006 (ref 1.005–1.030)
pH: 5 (ref 5.0–8.0)

## 2024-01-20 LAB — RESP PANEL BY RT-PCR (RSV, FLU A&B, COVID)  RVPGX2
Influenza A by PCR: NEGATIVE
Influenza B by PCR: NEGATIVE
Resp Syncytial Virus by PCR: NEGATIVE
SARS Coronavirus 2 by RT PCR: NEGATIVE

## 2024-01-20 LAB — LACTIC ACID, PLASMA
Lactic Acid, Venous: 3 mmol/L (ref 0.5–1.9)
Lactic Acid, Venous: 1.3 mmol/L (ref 0.5–1.9)

## 2024-01-20 MED ORDER — SODIUM CHLORIDE 0.9 % IV BOLUS
1000.0000 mL | Freq: Once | INTRAVENOUS | Status: AC
  Administered 2024-01-20: 1000 mL via INTRAVENOUS

## 2024-01-20 NOTE — ED Notes (Signed)
 Patient states he is pre-diabetic.

## 2024-01-20 NOTE — ED Triage Notes (Signed)
 Patient arrives with complaints of worsening dental pain (infected root canal, on antibiotics). Rates pain a 6/10. Patient also reports having a near-syncopal episode yesterday as well. Patient would like labs drawn to check status of infection and he was recommended to have imaging of his mouth (he had lab drawn at [facility] that won't result for one week).

## 2024-01-20 NOTE — ED Notes (Signed)
 ED Provider at bedside.

## 2024-01-20 NOTE — Discharge Instructions (Signed)
 As discussed, your workup today was overall reassuring.  We have sent off blood cultures to see if there is any bacteria in your blood but think this is less likely given that your white count is normal and your vital signs appear normal.  Continue take antibiotic as prescribed by dentistry and recommend follow-up in the outpatient setting for further imaging of affected area.  As we discussed, I suspect there could be medication side effect of the prednisone that you were taking, so we recommend stop taking this medication.  You may continue to take Tylenol, ibuprofen or the narcotic pain medication you have prescribed.  I recommend follow-up with primary care for reassessment of your symptoms.  Please do not hesitate to return if the worrisome signs and symptoms we discussed become apparent.

## 2024-01-20 NOTE — ED Provider Notes (Signed)
 [place] EMERGENCY DEPARTMENT AT [facility] Provider Note   CSN: [id number] Arrival date & time: 01/20/24  7846     History  Chief Complaint  Patient presents with   Dental Problem    Trooper Olander is a 48 y.o. male.  HPI   48 year old male presents emergency department with a few different complaints.  Patient reports continued right lower molar pain.  States he had tooth removed with "cadaver bone" placed back in by dentistry a couple of weeks ago.  Was on amoxicillin and Augmentin.  States that the pain has not significantly improved.  Patient also reports feelings of lightheadedness.  States that this has tended to occur with positional changes such as getting up from a seated/lying down position.  States that it 1 episode happened in his primary care provider's office and he drinks more juice which improved symptoms.  Reports additional episode this morning when he got up from bed after having worsening right lower dental pain.  States he took a Tylenol/corticosteroid and lay back down in bed.  When he went to get back up again, felt lightheaded as if you are going to pass out.  Denies any chest pain, shortness of breath, exertional worsening of symptoms.  Patient does state that he was placed on corticosteroid for treatment of dental pain and states that the symptoms of lightheadedness occurred after he began taking this medication.  States he is concerned about infection spreading to his blood.  Denies any fevers, chills, palpitations.  Past medical history significant for allergic rhinitis, GERD, anxiety, asthma, hyperlipidemia, hypertension  Home Medications Prior to Admission medications   Medication Sig Start Date End Date Taking? Authorizing Provider  amoxicillin (AMOXIL) 875 MG tablet Take 875 mg by mouth 2 (two) times daily. 01/08/24  Yes [provider]  amoxicillin-clavulanate (AUGMENTIN) 500-125 MG tablet Take 1 tablet by mouth 2 (two) times daily.  01/18/24  Yes [provider]  celecoxib (CELEBREX) 200 MG capsule Take 200 mg by mouth daily. 01/19/24  Yes [provider]  ibuprofen (ADVIL) 800 MG tablet Take 800 mg by mouth every 6 (six) hours as needed. 01/18/24  Yes [provider]  methylPREDNISolone (MEDROL DOSEPAK) 4 MG TBPK tablet Take 4 mg by mouth as directed. 01/18/24  Yes [provider]  albuterol (VENTOLIN HFA) 108 (90 Base) MCG/ACT inhaler Inhale 1 puff into the lungs as needed. 10/31/22   [provider]  amLODipine (NORVASC) 10 MG tablet Take 1 tablet (10 mg total) by mouth daily. 09/07/23 10/07/23  Tolia, Sunit, DO  diazepam (VALIUM) 5 MG tablet Take 1 tablet (5 mg total) by mouth every 6 (six) hours as needed for muscle spasms. 08/15/23   Netta Corrigan, PA-C  EPINEPHrine 0.3 mg/0.3 mL IJ SOAJ injection Inject 0.3 mg into the muscle as needed for anaphylaxis. 08/15/23   Netta Corrigan, PA-C  ketoconazole (NIZORAL) 2 % cream Apply 1 Application topically 2 (two) times daily. 06/26/23   [provider]  Multiple Vitamin (MULTIVITAMIN) tablet Take 1 tablet by mouth daily.    [provider]  omeprazole (PRILOSEC) 40 MG capsule Take 1 capsule (40 mg total) by mouth daily before breakfast. 08/20/23   Iva Boop, MD  tadalafil (CIALIS) 5 MG tablet Take 5 mg by mouth as needed. 08/17/20   [provider]      Allergies    Penicillin g    Review of Systems   Review of Systems  All other systems reviewed  and are negative.   Physical Exam Updated Vital Signs BP (!) 149/99   Pulse (!) 56   Temp 98.1 F (36.7 C)   Resp (!) 21   Ht 5\' 10"  (1.778 m)   Wt 122.5 kg   SpO2 97%   BMI 38.74 kg/m  Physical Exam Vitals and nursing note reviewed.  Constitutional:      General: He is not in acute distress.    Appearance: He is well-developed.  HENT:     Head: Normocephalic and atraumatic.     Mouth/Throat:      Comments: Patient with sutures present  with molar absent in area as above.  No gingival tenderness.  No appreciable fluctuance.  No sublingual or submitted but or swelling.  No trismus.  Uvula midline right symmetric with phonation. Eyes:     Conjunctiva/sclera: Conjunctivae normal.  Cardiovascular:     Rate and Rhythm: Normal rate and regular rhythm.     Heart sounds: No murmur heard. Pulmonary:     Effort: Pulmonary effort is normal. No respiratory distress.     Breath sounds: Normal breath sounds.  Abdominal:     Palpations: Abdomen is soft.     Tenderness: There is no abdominal tenderness.  Musculoskeletal:        General: No swelling.     Cervical back: Neck supple.  Skin:    General: Skin is warm and dry.     Capillary Refill: Capillary refill takes less than 2 seconds.  Neurological:     Mental Status: He is alert.  Psychiatric:        Mood and Affect: Mood normal.     ED Results / Procedures / Treatments   Labs (all labs ordered are listed, but only abnormal results are displayed) Labs Reviewed  LACTIC ACID, PLASMA - Abnormal; Notable for the following components:      Result Value   Lactic Acid, Venous 3.0 (*)    All other components within normal limits  COMPREHENSIVE METABOLIC PANEL - Abnormal; Notable for the following components:   Glucose, Bld 161 (*)    ALT 49 (*)    All other components within normal limits  CBG MONITORING, ED - Abnormal; Notable for the following components:   Glucose-Capillary 170 (*)    All other components within normal limits  RESP PANEL BY RT-PCR (RSV, FLU A&B, COVID)  [id number]  CULTURE, BLOOD (ROUTINE X 2)  CULTURE, BLOOD (ROUTINE X 2)  LACTIC ACID, PLASMA  CBC WITH DIFFERENTIAL/PLATELET  URINALYSIS, W/ REFLEX TO CULTURE (INFECTION SUSPECTED)    EKG EKG Interpretation Date/Time:  Wednesday January 20 2024 10:44:06 EDT Ventricular Rate:  58 PR Interval:  170 QRS Duration:  102 QT Interval:  410 QTC Calculation: 403 R Axis:   41  Text Interpretation: Sinus rhythm  Confirmed by Edwin Dada ([id number]) on 01/20/2024 10:48:00 AM  Radiology No results found.  Procedures Procedures    Medications Ordered in ED Medications  sodium chloride 0.9 % bolus 1,000 mL (0 mLs Intravenous Stopped 01/20/24 1158)    ED Course/ Medical Decision Making/ A&P                                 Medical Decision Making Amount and/or Complexity of Data Reviewed Labs: ordered.   This patient presents to the ED for concern of lightheadedness, dental pain, this involves an extensive number of treatment options, and is a complaint that carries  with it a high risk of complications and morbidity.  The differential diagnosis includes Ludwig angina, Lemierre's disease, abscess, osteomyelitis, ACS, PE, orthostatic hypotension, seizure, dehydration, medication side effect.   Co morbidities that complicate the patient evaluation  See HPI   Additional history obtained:  Additional history obtained from EMR External records from outside source obtained and reviewed including hospital records   Lab Tests:  I Ordered, and personally interpreted labs.  The pertinent results include: No leukocytosis.  No evidence of anemia.  Platelets within range.  No Electra abnormalities.  No renal dysfunction.  Slight elevation of ALT of 49.  Lactic acid of 3.  CBG of 178.  Viral testing negative.  Initial lactic of 3 with repeat 1.3.   Imaging Studies ordered:  N/a   Cardiac Monitoring: / EKG:  The patient was maintained on a cardiac monitor.  I personally viewed and interpreted the cardiac monitored which showed an underlying rhythm of: sinus rhythm   Consultations Obtained:  N/a   Problem List / ED Course / Critical interventions / Medication management  Positional lightheadedness, dental pain I ordered medication including normal saline   Reevaluation of the patient after these medicines showed that the patient improved I have reviewed the patients home medicines and have  made adjustments as needed   Social Determinants of Health:  Denies tobacco, licit drug use.   Test / Admission - Considered:  Positional lightheadedness, dental pain Vitals signs significant for hypertension blood pressure 157/101. Otherwise within normal range and stable throughout visit. Laboratory/imaging studies significant for: See above 48 year old male presents emergency department with a few different complaints.  Patient reports continued right lower molar pain.  States he had tooth removed with "cadaver bone" placed back in by dentistry a couple of weeks ago.  Was on amoxicillin and Augmentin.  States that the pain has not significantly improved.  Patient also reports feelings of lightheadedness.  States that this has tended to occur with positional changes such as getting up from a seated/lying down position.  States that it 1 episode happened in his primary care provider's office and he drinks more juice which improved symptoms.  Reports additional episode this morning when he got up from bed after having worsening right lower dental pain.  States he took a Tylenol and lay back down in bed.  When he went to get back up again, felt lightheaded as if you are going to pass out.  Denies any chest pain, shortness of breath, exertional worsening of symptoms.  Patient does state that he was placed on prednisone for treatment of dental pain and states that the symptoms of lightheadedness occurred after he began taking this medication.  States he is concerned about infection spreading to his blood.  Denies any fevers, chills, palpitations. On exam, dentition in good repair without obvious gingival tenderness or appreciable purulent drainage from area where tooth was extracted.  Patient's workup today overall reassuring.  Laboratory studies without evidence of acute emergent process.  Initial lactic acid nonspecifically elevated 3.0 when repeated before IV fluid bolus was administered was 1.3;  concern for lab error.  Regarding light headedness or feelings of going to pass out, seems to be positional in nature per patient's history.  Also closely tied to Solu-Medrol use in the mornings for dental pain.  No evidence of arrhythmia on EKG or cardiac monitoring for patient's 4+ hours today while in the ED.  No evidence of significant blood glucose abnormality.  Patient's symptoms not exertional in  nature without acute ischemic change on EKG.  Patient Wells PE score 0 and PERC negative; low suspicion for PE.  Will recommend adequate oral hydration and abstinence from Solu-Medrol.  Close follow-up with dentistry recommended in the outpatient setting for reassessment regarding continued dental pain.  No clinical evidence of Ludwig angina, abscess formation requiring emergent imaging at this time.  Treatment plan discussed at length with patient and he acknowledged understanding was agreeable to said plan.  Patient will well-appearing, afebrile in no acute distress. Worrisome signs and symptoms were discussed with the patient, and the patient acknowledged understanding to return to the ED if noticed. Patient was stable upon discharge.          Final Clinical Impression(s) / ED Diagnoses Final diagnoses:  Pain, dental  Positional lightheadedness    Rx / DC Orders ED Discharge Orders     None         Peter Garter, [place] 01/20/24 1435    Franne Forts, DO 01/20/24 1456

## 2024-01-21 ENCOUNTER — Other Ambulatory Visit: Payer: Self-pay

## 2024-01-21 ENCOUNTER — Telehealth: Payer: Self-pay | Admitting: Radiology

## 2024-01-21 ENCOUNTER — Ambulatory Visit: Payer: BC Managed Care – PPO | Attending: Cardiology | Admitting: Cardiology

## 2024-01-21 VITALS — BP 150/90 | HR 71 | Resp 16 | Ht 70.0 in | Wt 272.0 lb

## 2024-01-21 DIAGNOSIS — G4730 Sleep apnea, unspecified: Secondary | ICD-10-CM

## 2024-01-21 DIAGNOSIS — I517 Cardiomegaly: Secondary | ICD-10-CM

## 2024-01-21 DIAGNOSIS — R7303 Prediabetes: Secondary | ICD-10-CM

## 2024-01-21 DIAGNOSIS — E782 Mixed hyperlipidemia: Secondary | ICD-10-CM

## 2024-01-21 DIAGNOSIS — I10 Essential (primary) hypertension: Secondary | ICD-10-CM | POA: Diagnosis not present

## 2024-01-21 MED ORDER — HYDROCHLOROTHIAZIDE 25 MG PO TABS
25.0000 mg | ORAL_TABLET | Freq: Every morning | ORAL | 3 refills | Status: AC
Start: 2024-01-21 — End: ?

## 2024-01-21 MED ORDER — LOSARTAN POTASSIUM 25 MG PO TABS: 25.0000 mg | ORAL_TABLET | Freq: Every evening | ORAL | 3 refills | Status: AC

## 2024-01-21 NOTE — Progress Notes (Signed)
 Cardiology Office Note:    NAME:  Dennis Aguilar    MRN: [id number] DOB:  1976-03-01   PCP:  Andi Devon, MD  Former Cardiology Providers: None Primary Cardiologist:  Tessa Lerner, DO, [facility] (established care 08/11/23) Electrophysiologist:  None    Chief Complaint  Patient presents with  . Follow-up    Reevaluation of chest tightness and discuss test results    History of Present Illness:    Dennis Aguilar is a 48 y.o. Caucasian male whose past medical history and cardiovascular risk factors includes: dysphagia s/p dilatation, hx of sleep apnea not on device, HTN, HLD, prediabetes, obesity.   In October 2024 he presented to the office for evaluation of chest tightness and shortness of breath prior to endoscopy.  Patient underwent echocardiogram and stress test which are noted below for the records and reviewed with him at today's office visit.  In the last office visit patient felt that the chest tightness and shortness of breath may be secondary to benazepril and hydrochlorothiazide with intracanzole.  For safety reasons I had discontinued the medication as he did not feel safe and gave him amlodipine as an alternative until he follows up with PCP.  Patient blood pressure at today's office visit is high but he ran out of amlodipine.  On most recent echocardiogram patient was noted to have preserved LVEF, right ventricular systolic function is preserved but size appeared to be dilated, RVSP did not illustrate concerns for pulmonary hypertension.  Patient was recommended to follow-up with PCP for evaluation of sleep apnea.  Patient informs me that his sleep study still pending this has not been arranged.  Patient is wanting to restart his blood pressure medications.  And he recently underwent root canal extraction and says the pain was severe and persisted beyond the expected 3 or 4-day postprocedural discomfort he went to the hospital to make sure that he does not have bacteremia.  Blood  cultures were drawn and results were pending he is anticipating a phone call back.  He has been on antibiotics for 2 weeks and was prescribed prednisone for pain.  Prednisone significantly helped his pain but stopped it secondary to feeling lightheaded and dizzy.  He is currently managing his pain with Celebrex or Motrin.  Current Medications: Current Meds  Medication Sig  . albuterol (VENTOLIN HFA) 108 (90 Base) MCG/ACT inhaler Inhale 1 puff into the lungs as needed.  Marland Kitchen amoxicillin-clavulanate (AUGMENTIN) 500-125 MG tablet Take 1 tablet by mouth 2 (two) times daily.  . celecoxib (CELEBREX) 200 MG capsule Take 200 mg by mouth daily.  Marland Kitchen EPINEPHrine 0.3 mg/0.3 mL IJ SOAJ injection Inject 0.3 mg into the muscle as needed for anaphylaxis.  . hydrochlorothiazide (HYDRODIURIL) 25 MG tablet Take 1 tablet (25 mg total) by mouth in the morning.  Marland Kitchen ibuprofen (ADVIL) 800 MG tablet Take 800 mg by mouth every 6 (six) hours as needed.  Marland Kitchen ketoconazole (NIZORAL) 2 % cream Apply 1 Application topically 2 (two) times daily.  Marland Kitchen losartan (COZAAR) 25 MG tablet Take 1 tablet (25 mg total) by mouth every evening.  . Multiple Vitamin (MULTIVITAMIN) tablet Take 1 tablet by mouth daily.  . tadalafil (CIALIS) 5 MG tablet Take 5 mg by mouth as needed.     Allergies:    Penicillin g   Past Medical History: Past Medical History:  Diagnosis Date  . Allergy   . Anxiety   . GERD (gastroesophageal reflux disease)   . High cholesterol   . Hx of  colonic polyps 08/23/2018  . Hypertension   . Recurrent upper respiratory infection (URI)   . Small intestinal bacterial overgrowth 02/02/2018   + H2 Lactulose breath test 01/25/2018    Past Surgical History: Past Surgical History:  Procedure Laterality Date  . COLONOSCOPY WITH ESOPHAGOGASTRODUODENOSCOPY (EGD)  2019  . HAND SURGERY Right   . KNEE ARTHROSCOPY WITH LATERAL MENISECTOMY Left     Social History: Social History   Tobacco Use  . Smoking status: Never  .  Smokeless tobacco: Former    Types: Chew    Quit date: 01/19/2003  Vaping Use  . Vaping status: Unknown  Substance Use Topics  . Alcohol use: Yes    Comment: occasinal  . Drug use: Never    Family History: Family History  Problem Relation Age of Onset  . Urticaria Mother   . Heart disease Father   . Allergic rhinitis Father   . Urticaria Sister   . Heart attack Maternal Grandfather   . Heart disease Paternal Grandfather   . Dementia Paternal Grandfather   . Prostate cancer Paternal Grandfather   . Esophageal cancer Neg Hx   . Stomach cancer Neg Hx   . Liver disease Neg Hx   . Asthma Neg Hx   . Eczema Neg Hx     ROS:   Review of Systems  Cardiovascular:  Negative for chest pain, claudication, irregular heartbeat, leg swelling, near-syncope, orthopnea, palpitations, paroxysmal nocturnal dyspnea and syncope.  Respiratory:  Negative for shortness of breath.   Hematologic/Lymphatic: Negative for bleeding problem.    EKGs/Labs/Other Studies Reviewed:    Echocardiogram: 08/18/2023 1. Left ventricular ejection fraction, by estimation, is 60 to 65%. Left ventricular ejection fraction by 3D volume is 60 %. The left ventricle has normal function. The left ventricle has no regional wall motion abnormalities. Left ventricular diastolic parameters were normal. The average left ventricular global longitudinal strain is -21.6 %. The global longitudinal strain is normal. 2. Right ventricular systolic function is normal. The right ventricular size is severely enlarged. 3. The mitral valve is normal in structure. No evidence of mitral valve regurgitation. No evidence of mitral stenosis. 4. The aortic valve is tricuspid. Aortic valve regurgitation is not visualized. No aortic stenosis is present. 5. The inferior vena cava is normal in size with <50% respiratory variability, suggesting right atrial pressure of 8 mmHg.   Stress Testing:  GXT 07/2023 Negative adequate stress test without  evidence of ischemia at given workload. 10.1 METS - no chest pain.   Coronary Calcification Report: 01/22/2022 Coronary calcium score of 0. This was 0 percentile for age-, race-,and sex-matched controls. Mild aortic atheroslclerosis.  Labs:    Latest Ref Rng & Units 01/20/2024    9:28 AM 08/15/2023    9:20 AM 04/26/2016    2:27 PM  CBC  WBC 4.0 - 10.5 K/uL 5.3  8.2  6.8   Hemoglobin 13.0 - 17.0 g/dL 16.1  09.6  04.5   Hematocrit 39.0 - 52.0 % 44.9  46.0  44.6   Platelets 150 - 400 K/uL 232  257  255        Latest Ref Rng & Units 01/20/2024    9:28 AM 08/15/2023    9:20 AM 04/26/2016    2:27 PM  BMP  Glucose 70 - 99 mg/dL 409  811  914   BUN 6 - 20 mg/dL 11  7  14    Creatinine 0.61 - 1.24 mg/dL 7.82  9.56  2.13   Sodium 135 -  145 mmol/L 136  138  138   Potassium 3.5 - 5.1 mmol/L 4.7  4.4  4.0   Chloride 98 - 111 mmol/L 101  104  105   CO2 22 - 32 mmol/L 26  22  26    Calcium 8.9 - 10.3 mg/dL 9.4  9.3  9.4       Latest Ref Rng & Units 01/20/2024    9:28 AM 08/15/2023    9:20 AM 04/26/2016    2:27 PM  CMP  Glucose 70 - 99 mg/dL 161  096  045   BUN 6 - 20 mg/dL 11  7  14    Creatinine 0.61 - 1.24 mg/dL 4.09  8.11  9.14   Sodium 135 - 145 mmol/L 136  138  138   Potassium 3.5 - 5.1 mmol/L 4.7  4.4  4.0   Chloride 98 - 111 mmol/L 101  104  105   CO2 22 - 32 mmol/L 26  22  26    Calcium 8.9 - 10.3 mg/dL 9.4  9.3  9.4   Total Protein 6.5 - 8.1 g/dL 7.3     Total Bilirubin 0.0 - 1.2 mg/dL 0.9     Alkaline Phos 38 - 126 U/L 53     AST 15 - 41 U/L 27     ALT 0 - 44 U/L 49       No results found for: "CHOL", "HDL", "LDLCALC", "LDLDIRECT", "TRIG", "CHOLHDL" No results for input(s): "LIPOA" in the last 8760 hours. No components found for: "NTPROBNP" No results for input(s): "PROBNP" in the last 8760 hours. No results for input(s): "TSH" in the last 8760 hours.  Physical Exam:    Today's Vitals   01/21/24 0810  BP: (!) 150/90  Pulse: 71  Resp: 16  SpO2: 98%  Weight: 272 lb  (123.4 kg)  Height: 5\' 10"  (1.778 m)   Body mass index is 39.03 kg/m. Wt Readings from Last 3 Encounters:  01/21/24 272 lb (123.4 kg)  01/20/24 270 lb (122.5 kg)  08/20/23 274 lb (124.3 kg)    Physical Exam  Constitutional: No distress.  hemodynamically stable  Neck: No JVD present.  Cardiovascular: Normal rate, regular rhythm, S1 normal, S2 normal, intact distal pulses and normal pulses. Exam reveals no gallop, no S3 and no S4.  No murmur heard. Pulmonary/Chest: Effort normal and breath sounds normal. No stridor. He has no wheezes. He has no rales.  Abdominal: Soft. Bowel sounds are normal. He exhibits no distension. There is no abdominal tenderness.  Musculoskeletal:        General: No edema.     Cervical back: Neck supple.  Neurological: He is alert and oriented to person, place, and time. He has intact cranial nerves (2-12).  Skin: Skin is warm and moist.   Impression & Recommendation(s):  Impression:   ICD-10-CM   1. Right ventricular dilation  I51.7 ECHOCARDIOGRAM COMPLETE    2. Sleep apnea, unspecified type  G47.30 Itamar Sleep Study    3. Benign hypertension  I10 Basic metabolic panel with GFR    losartan (COZAAR) 25 MG tablet    hydrochlorothiazide (HYDRODIURIL) 25 MG tablet    Basic metabolic panel with GFR    4. Mixed hyperlipidemia  E78.2     5. Prediabetes  R73.03         Recommendation(s):  Right ventricular dilation In the recent echocardiogram from October 2024 patient was noted to have a dilated right ventricle RV systolic function is preserved.  No significant  RVSP to suggest pulmonary hypertension physiology. Reemphasized importance of healthy weight loss as well as getting evaluated for sleep apnea which he has had for many years in the past but chose not to be on therapy. Clinically he is not in right heart failure. He is euvolemic on physical examination. Will continue to monitor RV function as modifiable cardiovascular risk factors are getting  addressed.  However, if no significant change on follow-up echocardiograms right heart catheterization would be meaningful to further evaluate pressures/hemodynamics. Will tentatively scheduled for a repeat echocardiogram in March 2026.  Sleep apnea, unspecified type Has a diagnosis of sleep apnea, unsure if it is obstructive or central. Patient states that he has tried device therapy in the past but has been unsuccessful. Patient will be scheduled for Itamar study and based on the results will need to be referred to Dr. Carolanne Grumbling for sleep medicine.  Benign hypertension All of his blood pressures are not at goal. He is stopped his blood pressure medications, for reasons unknown. In the past he was on benazepril and hydrochlorothiazide which he discontinued due to concerns with drug to drug interactions with itraconazole.  For the time being I placed him on amlodipine until he follows up with PCP.  Patient states that he completed his amlodipine prescription but never requested a refill. He is no longer on itraconazole and wishes to go back to the BP meds. Will start losartan 25 mg p.o. every afternoon. Will start hydrochlorothiazide 25 mg p.o. every morning. BMP in one week to check renal function and electrolytes  Mixed hyperlipidemia Patient has endorse that he does have hyperlipidemia but based on the medication list he is currently not on lipid-lowering agents. Patient is asked to follow-up with primary team for further guidance. His coronary calcium score in 2023 was 0.  Orders Placed:  Orders Placed This Encounter  Procedures  . Basic metabolic panel with GFR    Standing Status:   Future    Number of Occurrences:   1    Expected Date:   01/28/2024    Expiration Date:   01/20/2025  . ECHOCARDIOGRAM COMPLETE    Standing Status:   Future    Expected Date:   01/20/2025    Expiration Date:   01/20/2025    Where should this test be performed:   [facility])     Does the patient weigh less than or greater than 250 lbs?:   Patient weighs greater than 250 lbs             272 lbs    Perflutren DEFINITY (image enhancing agent) should be administered unless hypersensitivity or allergy exist:   Administer Perflutren    Reason for exam-Echo:   Other-Full Diagnosis List    Full ICD-10/Reason for Exam:   Right ventricular dilation [563875]  . Itamar Sleep Study    Standing Status:   Future    Expected Date:   01/21/2024    Expiration Date:   01/20/2025    Does the patient have access to and comfortable using a smartphone or tablet?:   Yes - WatchPat One. Device is disposable   Final Medication List:    Meds ordered this encounter  Medications  . losartan (COZAAR) 25 MG tablet    Sig: Take 1 tablet (25 mg total) by mouth every evening.    Dispense:  30 tablet    Refill:  3  . hydrochlorothiazide (HYDRODIURIL) 25 MG tablet    Sig: Take 1  tablet (25 mg total) by mouth in the morning.    Dispense:  30 tablet    Refill:  3    Medications Discontinued During This Encounter  Medication Reason  . amoxicillin (AMOXIL) 875 MG tablet Change in therapy  . diazepam (VALIUM) 5 MG tablet Patient Preference  . methylPREDNISolone (MEDROL DOSEPAK) 4 MG TBPK tablet Completed Course  . omeprazole (PRILOSEC) 40 MG capsule Patient Preference     Current Outpatient Medications:  .  albuterol (VENTOLIN HFA) 108 (90 Base) MCG/ACT inhaler, Inhale 1 puff into the lungs as needed., Disp: , Rfl:  .  amoxicillin-clavulanate (AUGMENTIN) 500-125 MG tablet, Take 1 tablet by mouth 2 (two) times daily., Disp: , Rfl:  .  celecoxib (CELEBREX) 200 MG capsule, Take 200 mg by mouth daily., Disp: , Rfl:  .  EPINEPHrine 0.3 mg/0.3 mL IJ SOAJ injection, Inject 0.3 mg into the muscle as needed for anaphylaxis., Disp: 1 each, Rfl: 0 .  hydrochlorothiazide (HYDRODIURIL) 25 MG tablet, Take 1 tablet (25 mg total) by mouth in the morning., Disp: 30 tablet, Rfl: 3 .  ibuprofen (ADVIL) 800 MG  tablet, Take 800 mg by mouth every 6 (six) hours as needed., Disp: , Rfl:  .  ketoconazole (NIZORAL) 2 % cream, Apply 1 Application topically 2 (two) times daily., Disp: , Rfl:  .  losartan (COZAAR) 25 MG tablet, Take 1 tablet (25 mg total) by mouth every evening., Disp: 30 tablet, Rfl: 3 .  Multiple Vitamin (MULTIVITAMIN) tablet, Take 1 tablet by mouth daily., Disp: , Rfl:  .  tadalafil (CIALIS) 5 MG tablet, Take 5 mg by mouth as needed., Disp: , Rfl:   Consent:    NA  Disposition:   1 year follow up   His questions and concerns were addressed to his satisfaction. He voices understanding of the recommendations provided during this encounter.    Signed, Tessa Lerner, DO, [facility]  [facility]  [address] [place], [place] [address] 01/27/2024 6:05 PM

## 2024-01-21 NOTE — Patient Instructions (Addendum)
 Medication Instructions:  Your physician has recommended you make the following change in your medication:   START hydrochlorothiazide 25 mg once daily in the morning   START losartan 25 mg once daily in the evening    *If you need a refill on your cardiac medications before your next appointment, please call your pharmacy*  Lab Work: To be completed in 1 week: BMP  If you have labs (blood work) drawn today and your tests are completely normal, you will receive your results only by: MyChart Message (if you have MyChart) OR A paper copy in the mail If you have any lab test that is abnormal or we need to change your treatment, we will call you to review the results.  Testing/Procedures: Your physician has requested that you have an echocardiogram prior to your 1 year follow-up with Dr. Odis Hollingshead. Echocardiography is a painless test that uses sound waves to create images of your heart. It provides your doctor with information about the size and shape of your heart and how well your heart's chambers and valves are working. This procedure takes approximately one hour. There are no restrictions for this procedure. Please do NOT wear cologne, perfume, aftershave, or lotions (deodorant is allowed). Please arrive 15 minutes prior to your appointment time.  Please note: We ask at that you not bring children with you during ultrasound (echo/ vascular) testing. Due to room size and safety concerns, children are not allowed in the ultrasound rooms during exams. Our front office staff cannot provide observation of children in our lobby area while testing is being conducted. An adult accompanying a patient to their appointment will only be allowed in the ultrasound room at the discretion of the ultrasound technician under special circumstances. We apologize for any inconvenience.   Your physician has recommended that you have an Itamar sleep study. This test records several body functions during sleep,  including: brain activity, eye movement, oxygen and carbon dioxide blood levels, heart rate and rhythm, breathing rate and rhythm, the flow of air through your mouth and nose, snoring, body muscle movements, and chest and belly movement.   Follow-Up: At [facility], you and your health needs are our priority.  As part of our continuing mission to provide you with exceptional heart care, we have created designated Provider Care Teams.  These Care Teams include your primary Cardiologist (physician) and Advanced Practice Providers (APPs -  Physician Assistants and Nurse Practitioners) who all work together to provide you with the care you need, when you need it.  Your next appointment:   1 year(s)  The format for your next appointment:   In Person  Provider:   Tessa Lerner, DO {  Other Instructions   1st Floor: - Lobby - Registration  - Pharmacy  - Lab - Cafe  2nd Floor: - PV Lab - Diagnostic Testing (echo, CT, nuclear med)  3rd Floor: - Vacant  4th Floor: - TCTS (cardiothoracic surgery) - AFib Clinic - Structural Heart Clinic - Vascular Surgery  - Vascular Ultrasound  5th Floor: - [facility]Care Cardiology (general and EP) - Clinical Pharmacy for coumadin, hypertension, lipid, weight-loss medications, and med management appointments    Valet parking services will be available as well.

## 2024-01-21 NOTE — Telephone Encounter (Signed)
 1. Patient agreement reviewed and signed on 01/21/24 2. WatchPAT issued to patient on 01/21/24 by Clyda Hurdle. Patient aware to not open the WatchPAT box until contacted with the activation PIN. 3. Patient profile initialized in CloudPAT on 01/21/24 by Orpha Bur S 4. Device serial number: [id number]

## 2024-01-25 LAB — CULTURE, BLOOD (ROUTINE X 2)
Culture: NO GROWTH
Culture: NO GROWTH
Special Requests: ADEQUATE
Special Requests: ADEQUATE

## 2024-01-27 ENCOUNTER — Encounter: Payer: Self-pay | Admitting: Cardiology

## 2024-06-20 ENCOUNTER — Encounter: Payer: Self-pay | Admitting: Internal Medicine

## 2024-07-21 ENCOUNTER — Ambulatory Visit (AMBULATORY_SURGERY_CENTER)

## 2024-07-21 VITALS — Ht 70.0 in | Wt 268.0 lb

## 2024-07-21 DIAGNOSIS — Z860100 Personal history of colon polyps, unspecified: Secondary | ICD-10-CM

## 2024-07-21 MED ORDER — NA SULFATE-K SULFATE-MG SULF 17.5-3.13-1.6 GM/177ML PO SOLN: 1.0000 | Freq: Once | ORAL | 0 refills | Status: AC

## 2024-07-21 NOTE — Progress Notes (Signed)
 No egg or soy allergy known to patient  No issues known to pt with past sedation with any surgeries or procedures Patient denies ever being told they had issues or difficulty with intubation  No FH of Malignant Hyperthermia Pt is not on diet pills Pt is not on  home 02  Pt is not on blood thinners  Pt denies issues with constipation  No A fib or A flutter Have any cardiac testing pending--NO Pt can ambulate-independently  Pt denies use of chewing tobacco  Discussed diabetic I weight loss medication holds-GLP1- first dose 07/18/2024  Discussed NSAID holds Checked BMI Pt instructed to use Singlecare.com or GoodRx for a price reduction on prep  Patient's chart reviewed by Norleen Schillings CNRA prior to previsit and patient appropriate for the [facility].  Pre visit completed and red dot placed by patient's name on their procedure day (on provider's schedule).

## 2024-07-22 ENCOUNTER — Encounter: Payer: Self-pay | Admitting: Internal Medicine

## 2024-08-03 NOTE — Progress Notes (Unsigned)
 [facility] History and Physical   Primary Care Physician:  Theo Iha, MD   Reason for Procedure:    Encounter Diagnosis  Name Primary?   Hx of colonic polyps - sessile serrated Yes     Plan:    colonoscopy     HPI: Dennis Aguilar is a 48 y.o. male here for surveillance exam - had 2 ssp's removed 2019, otherwise NL exam.   Past Medical History:  Diagnosis Date   Allergy    Anxiety    GERD (gastroesophageal reflux disease)    Heart murmur    At birth- resolved   High cholesterol    Hx of colonic polyps 08/23/2018   Hypertension    Recurrent upper respiratory infection (URI)    Sleep apnea    Does not wear CPAP   Small intestinal bacterial overgrowth 02/02/2018   + H2 Lactulose breath test 01/25/2018    Past Surgical History:  Procedure Laterality Date   COLONOSCOPY WITH ESOPHAGOGASTRODUODENOSCOPY (EGD)  2019   HAND SURGERY Right    KNEE ARTHROSCOPY WITH LATERAL MENISECTOMY Left      Current Outpatient Medications  Medication Sig Dispense Refill   losartan  (COZAAR ) 25 MG tablet Take 1 tablet (25 mg total) by mouth every evening. 30 tablet 3   albuterol  (VENTOLIN  HFA) 108 (90 Base) MCG/ACT inhaler Inhale 1 puff into the lungs as needed.     celecoxib (CELEBREX) 200 MG capsule Take 200 mg by mouth daily. (Patient not taking: Reported on 07/21/2024)     EPINEPHrine  0.3 mg/0.3 mL IJ SOAJ injection Inject 0.3 mg into the muscle as needed for anaphylaxis. 1 each 0   hydrochlorothiazide  (HYDRODIURIL ) 25 MG tablet Take 1 tablet (25 mg total) by mouth in the morning. 30 tablet 3   ibuprofen (ADVIL) 800 MG tablet Take 800 mg by mouth every 6 (six) hours as needed. (Patient not taking: Reported on 07/21/2024)     itraconazole (SPORANOX) 10 MG/ML solution Take 100 mg by mouth 2 (two) times daily.     ketoconazole (NIZORAL) 2 % cream Apply 1 Application topically 2 (two) times daily. (Patient not taking: Reported on 07/21/2024)     Multiple Vitamin  (MULTIVITAMIN) tablet Take 1 tablet by mouth daily. (Patient not taking: Reported on 07/21/2024)     nystatin (MYCOSTATIN) 100000 UNIT/ML suspension Take 5 mLs by mouth.     semaglutide-weight management (WEGOVY) 0.5 MG/0.5ML SOAJ SQ injection Inject 0.5 mg into the skin once a week.     tadalafil (CIALIS) 5 MG tablet Take 5 mg by mouth as needed.     Current Facility-Administered Medications  Medication Dose Route Frequency Provider Last Rate Last Admin   0.9 %  sodium chloride  infusion  500 mL Intravenous Once Avram Lupita BRAVO, MD        Allergies as of 08/04/2024 - Review Complete 08/04/2024  Allergen Reaction Noted   Penicillin g Hives 07/15/2023    Family History  Problem Relation Age of Onset   Urticaria Mother    Prostate cancer Father    Heart disease Father    Allergic rhinitis Father    Urticaria Sister    Heart attack Maternal Grandfather    Heart disease Paternal Grandfather    Dementia Paternal Grandfather    Prostate cancer Paternal Grandfather    Esophageal cancer Neg Hx    Stomach cancer Neg Hx    Liver disease Neg Hx    Asthma Neg Hx    Eczema Neg Hx  Colon cancer Neg Hx    Rectal cancer Neg Hx     Social History   Socioeconomic History   Marital status: Married    Spouse name: Not on file   Number of children: 6   Years of education: Not on file   Highest education level: Not on file  Occupational History   Occupation: Garment/textile technologist  Tobacco Use   Smoking status: Never   Smokeless tobacco: Former    Types: Chew    Quit date: 01/19/2003  Vaping Use   Vaping status: Never Used  Substance and Sexual Activity   Alcohol use: Yes    Comment: occasinal   Drug use: Never   Sexual activity: Not on file  Other Topics Concern   Not on file  Social History Narrative   The patient is married with 6 sons as of 2019 they range in age from 1-13   He is a Garment/textile technologist   2 caffeinated beverages daily   No smoking tobacco or drug use or alcohol   Social  Drivers of Corporate investment banker Strain: Not on file  Food Insecurity: Low Risk  (07/15/2023)   Received from [facility]   Hunger Vital Sign    Within the past 12 months, you worried that your food would run out before you got money to buy more: Never true    Within the past 12 months, the food you bought just didn't last and you didn't have money to get more. : Never true  Transportation Needs: No Transportation Needs (07/15/2023)   Received from [facility]    In the past 12 months, has lack of reliable transportation kept you from medical appointments, meetings, work or from getting things needed for daily living? : No  Physical Activity: Not on file  Stress: Not on file  Social Connections: Unknown (03/09/2022)   Received from [facility]   Social Network    Social Network: Not on file  Intimate Partner Violence: Unknown (01/29/2022)   Received from [facility]   HITS    Physically Hurt: Not on file    Insult or Talk Down To: Not on file    Threaten Physical Harm: Not on file    Scream or Curse: Not on file    Review of Systems:  All other review of systems negative except as mentioned in the HPI.  Physical Exam: Vital signs BP (!) 148/97   Pulse 78   Temp 97.6 F (36.4 C)   Ht 5' 10 (1.778 m)   Wt 268 lb (121.6 kg)   SpO2 98%   BMI 38.45 kg/m   General:   Alert,  Well-developed, well-nourished, pleasant and cooperative in NAD Lungs:  Clear throughout to auscultation.   Heart:  Regular rate and rhythm; no murmurs, clicks, rubs,  or gallops. Abdomen:  Soft, nontender and nondistended. Normal bowel sounds.   Neuro/Psych:  Alert and cooperative. Normal mood and affect. A and O x 3   @Arlyss Weathersby  CHARLENA Commander, MD, [facility] [phone] (pager) 08/04/2024 10:15 AM@

## 2024-08-04 ENCOUNTER — Encounter: Payer: Self-pay | Admitting: Internal Medicine

## 2024-08-04 ENCOUNTER — Ambulatory Visit: Admitting: Internal Medicine

## 2024-08-04 VITALS — BP 109/51 | HR 66 | Temp 97.6°F | Resp 13 | Ht 70.0 in | Wt 268.0 lb

## 2024-08-04 DIAGNOSIS — Z1211 Encounter for screening for malignant neoplasm of colon: Secondary | ICD-10-CM | POA: Diagnosis present

## 2024-08-04 DIAGNOSIS — K5730 Diverticulosis of large intestine without perforation or abscess without bleeding: Secondary | ICD-10-CM

## 2024-08-04 DIAGNOSIS — Z860100 Personal history of colon polyps, unspecified: Secondary | ICD-10-CM

## 2024-08-04 DIAGNOSIS — Z860101 Personal history of adenomatous and serrated colon polyps: Secondary | ICD-10-CM | POA: Diagnosis not present

## 2024-08-04 MED ORDER — SODIUM CHLORIDE 0.9 % IV SOLN: 500.0000 mL | Freq: Once | INTRAVENOUS | Status: DC

## 2024-08-04 NOTE — Op Note (Signed)
 [facility] Patient Name: Dennis Aguilar Procedure Date: 08/04/2024 10:07 AM MRN: [id number] Endoscopist: Lupita FORBES Commander , MD, [id number] Age: 48 Referring MD:  Date of Birth: 04-19-1976 Gender: Male Account #: [id number] Procedure:                Colonoscopy Indications:              High risk colon cancer surveillance: Personal                            history of colonic polyps, Last colonoscopy: 2019 Medicines:                Monitored Anesthesia Care Procedure:                Pre-Anesthesia Assessment:                           - Prior to the procedure, a History and Physical                            was performed, and patient medications and                            allergies were reviewed. The patient's tolerance of                            previous anesthesia was also reviewed. The risks                            and benefits of the procedure and the sedation                            options and risks were discussed with the patient.                            All questions were answered, and informed consent                            was obtained. Prior Anticoagulants: The patient has                            taken no anticoagulant or antiplatelet agents. ASA                            Grade Assessment: II - A patient with mild systemic                            disease. After reviewing the risks and benefits,                            the patient was deemed in satisfactory condition to                            undergo the procedure.  After obtaining informed consent, the colonoscope                            was passed under direct vision. Throughout the                            procedure, the patient's blood pressure, pulse, and                            oxygen saturations were monitored continuously. The                            Olympus Scope SN: [id number] was introduced through                            the anus and  advanced to the the cecum, identified                            by appendiceal orifice and ileocecal valve. The                            colonoscopy was performed without difficulty. The                            patient tolerated the procedure well. The quality                            of the bowel preparation was good. The ileocecal                            valve, appendiceal orifice, and rectum were                            photographed. The bowel preparation used was SUPREP                            via split dose instruction. Scope In: 10:26:41 AM Scope Out: 10:38:00 AM Scope Withdrawal Time: 0 hours 9 minutes 40 seconds  Total Procedure Duration: 0 hours 11 minutes 19 seconds  Findings:                 The perianal and digital rectal examinations were                            normal. Pertinent negatives include normal prostate                            (size, shape, and consistency).                           Multiple small-mouthed diverticula were found in                            the sigmoid colon.  The exam was otherwise without abnormality on                            direct and retroflexion views. Complications:            No immediate complications. Estimated Blood Loss:     Estimated blood loss: none. Impression:               - Diverticulosis in the sigmoid colon.                           - The examination was otherwise normal on direct                            and retroflexion views.                           - No specimens collected.                           - Personal history of colonic polyps. 2 diminutive                            sessile serrated polyps removed 2019 Recommendation:           - Patient has a contact number available for                            emergencies. The signs and symptoms of potential                            delayed complications were discussed with the                            patient.  Return to normal activities tomorrow.                            Written discharge instructions were provided to the                            patient.                           - Resume previous diet.                           - Continue present medications.                           - Repeat colonoscopy in 7 years for surveillance. Lupita FORBES Commander, MD 08/04/2024 10:47:55 AM This report has been signed electronically.

## 2024-08-04 NOTE — Progress Notes (Signed)
 To PACU via stretcher, sedated, good respiratory effort, VSS.

## 2024-08-04 NOTE — Patient Instructions (Addendum)
 No polyps today.  I am recommending a routine repeat exam in 7 years.  You also have a condition called diverticulosis - common and not usually a problem. Please read the handout provided.  I appreciate the opportunity to care for you. Dennis Aguilar Commander, MD, [facility]  Handouts given: Diverticulosis Resume previous diet. Continue present medications.  Repeat colonoscopy in 7 years for surveillance.  YOU HAD AN ENDOSCOPIC PROCEDURE TODAY AT THE [facility]:   Refer to the procedure report that was given to you for any specific questions about what was found during the examination.  If the procedure report does not answer your questions, please call your gastroenterologist to clarify.  If you requested that your care partner not be given the details of your procedure findings, then the procedure report has been included in a sealed envelope for you to review at your convenience later.  YOU SHOULD EXPECT: Some feelings of bloating in the abdomen. Passage of more gas than usual.  Walking can help get rid of the air that was put into your GI tract during the procedure and reduce the bloating. If you had a lower endoscopy (such as a colonoscopy or flexible sigmoidoscopy) you may notice spotting of blood in your stool or on the toilet paper. If you underwent a bowel prep for your procedure, you may not have a normal bowel movement for a few days.  Please Note:  You might notice some irritation and congestion in your nose or some drainage.  This is from the oxygen used during your procedure.  There is no need for concern and it should clear up in a day or so.  SYMPTOMS TO REPORT IMMEDIATELY:  Following lower endoscopy (colonoscopy or flexible sigmoidoscopy):  Excessive amounts of blood in the stool  Significant tenderness or worsening of abdominal pains  Swelling of the abdomen that is new, acute  Fever of 100F or higher   For urgent or emergent issues, a gastroenterologist can be reached  at any hour by calling [phone]. Do not use MyChart messaging for urgent concerns.    DIET:  We do recommend a small meal at first, but then you may proceed to your regular diet.  Drink plenty of fluids but you should avoid alcoholic beverages for 24 hours.  ACTIVITY:  You should plan to take it easy for the rest of today and you should NOT DRIVE or use heavy machinery until tomorrow (because of the sedation medicines used during the test).    FOLLOW UP: Our staff will call the number listed on your records the next business day following your procedure.  We will call around 7:15- 8:00 am to check on you and address any questions or concerns that you may have regarding the information given to you following your procedure. If we do not reach you, we will leave a message.     If any biopsies were taken you will be contacted by phone or by letter within the next 1-3 weeks.  Please call us  at [phone] if you have not heard about the biopsies in 3 weeks.    SIGNATURES/CONFIDENTIALITY: You and/or your care partner have signed paperwork which will be entered into your electronic medical record.  These signatures attest to the fact that that the information above on your After Visit Summary has been reviewed and is understood.  Full responsibility of the confidentiality of this discharge information lies with you and/or your care-partner.

## 2024-08-05 ENCOUNTER — Telehealth: Payer: Self-pay | Admitting: *Deleted

## 2024-08-05 NOTE — Telephone Encounter (Signed)
  Follow up Call-     08/04/2024    8:51 AM 08/20/2023    2:41 PM  Call back number  Post procedure Call Back phone  # [phone]  Permission to leave phone message Yes Yes     Patient questions:  Do you have a fever, pain , or abdominal swelling? No. Pain Score  0 *  Have you tolerated food without any problems? Yes.    Have you been able to return to your normal activities? Yes.    Do you have any questions about your discharge instructions: Diet   No. Medications  No. Follow up visit  No.  Do you have questions or concerns about your Care? No.  Actions: * If pain score is 4 or above: No action needed, pain <4.

## 2024-08-25 ENCOUNTER — Telehealth: Payer: Self-pay | Admitting: Internal Medicine

## 2024-08-25 NOTE — Telephone Encounter (Signed)
 Patient experienced blood on tissue after his bowel movement this morning. He thinks he was either lightheaded or had a panic attack this morning afterwards. Better since then. Denies rectal pain, burning or itching. Recently started on Ozempic and has noticed fewer bowel movements. He does not feel hs is straining for the bowel movement. Noted this morning that the bowel movement was larger around than usual. Not taking any stool softeners.

## 2024-08-25 NOTE — Telephone Encounter (Signed)
 Inbound call from patient stating he had a procedure on 08/04/24 and was fine after procedure but has been experiencing some rectal bleeding and would like to speak to nurse  Requesting a call back  Please advise  Thank you

## 2024-08-25 NOTE — Telephone Encounter (Signed)
 Blood on tissue is minor and not a complication - watch for further bleeding

## 2024-08-26 NOTE — Telephone Encounter (Signed)
 Patient advised. Reports he has started a stool softener. He states he is going to focus on hydration and regular bowel movements. Call back further concerns.

## 2024-09-30 ENCOUNTER — Telehealth: Payer: Self-pay

## 2024-09-30 NOTE — Telephone Encounter (Signed)
**Note De-Identified Bhavin Monjaraz Obfuscation** Ordering provider: Dr Michele Associated diagnoses: Snoring-R06.83 and Witnessed Apnea-R06.81  WatchPAT PA obtained on 09/30/2024 by Masiel Gentzler, Avelina HERO, LPN. Authorization: I called [employer] and did a Itamar-HST PA with Junlie. Per Junlie, this PA has been approved from 09/30/2024 until 11/28/2024. Auth #: [id number]  Patient notified of PIN ([id number]) on 09/30/2024 Jachelle Fluty Notification Method: MyChart message. I also called the pt but got no answer so I left a message on his VM (Ok per [employer]) advising him that [employer] did approve coverage of his WatchPAT One-HST and that it is ok for him to proceed with his home sleep study. I did leave the PIN # of [id number] in the VM message.  Phone note routed to covering staff for follow-up.

## 2024-11-04 NOTE — Telephone Encounter (Signed)
 LMTCB

## 2025-01-04 ENCOUNTER — Other Ambulatory Visit (HOSPITAL_COMMUNITY)
